# Patient Record
Sex: Male | Born: 1965 | Race: White | Hispanic: No | Marital: Single | Smoking: Never smoker
Health system: Southern US, Community
[De-identification: ages and names within clinical notes are randomized; demographics above are authoritative.]

## PROBLEM LIST (undated history)

## (undated) DIAGNOSIS — G40909 Epilepsy, unspecified, not intractable, without status epilepticus: Secondary | ICD-10-CM

## (undated) DIAGNOSIS — F4310 Post-traumatic stress disorder, unspecified: Secondary | ICD-10-CM

## (undated) DIAGNOSIS — I10 Essential (primary) hypertension: Secondary | ICD-10-CM

## (undated) DIAGNOSIS — M1990 Unspecified osteoarthritis, unspecified site: Secondary | ICD-10-CM

## (undated) DIAGNOSIS — R51 Headache: Secondary | ICD-10-CM

## (undated) DIAGNOSIS — F419 Anxiety disorder, unspecified: Secondary | ICD-10-CM

## (undated) DIAGNOSIS — I2699 Other pulmonary embolism without acute cor pulmonale: Secondary | ICD-10-CM

## (undated) HISTORY — DX: Essential (primary) hypertension: I10

## (undated) HISTORY — PX: APPENDECTOMY: SHX54

## (undated) HISTORY — PX: BACK SURGERY: SHX140

---

## 1997-11-26 HISTORY — PX: HIP SURGERY: SHX245

## 2002-08-01 ENCOUNTER — Emergency Department (HOSPITAL_COMMUNITY): Admission: EM | Admit: 2002-08-01 | Discharge: 2002-08-01 | Payer: Self-pay | Admitting: Emergency Medicine

## 2002-10-10 ENCOUNTER — Encounter: Payer: Self-pay | Admitting: Emergency Medicine

## 2002-10-10 ENCOUNTER — Observation Stay (HOSPITAL_COMMUNITY): Admission: EM | Admit: 2002-10-10 | Discharge: 2002-10-10 | Payer: Self-pay | Admitting: Emergency Medicine

## 2002-10-10 ENCOUNTER — Encounter: Payer: Self-pay | Admitting: *Deleted

## 2003-02-21 ENCOUNTER — Emergency Department (HOSPITAL_COMMUNITY): Admission: EM | Admit: 2003-02-21 | Discharge: 2003-02-21 | Payer: Self-pay | Admitting: Emergency Medicine

## 2004-04-15 ENCOUNTER — Emergency Department (HOSPITAL_COMMUNITY): Admission: EM | Admit: 2004-04-15 | Discharge: 2004-04-15 | Payer: Self-pay | Admitting: Emergency Medicine

## 2008-08-26 HISTORY — PX: DOPPLER ECHOCARDIOGRAPHY: SHX263

## 2010-11-21 ENCOUNTER — Emergency Department (HOSPITAL_COMMUNITY)
Admission: EM | Admit: 2010-11-21 | Discharge: 2010-11-21 | Payer: Self-pay | Source: Home / Self Care | Admitting: Emergency Medicine

## 2011-02-05 LAB — CBC
HCT: 45.8 % (ref 39.0–52.0)
Hemoglobin: 15.7 g/dL (ref 13.0–17.0)
MCH: 32.4 pg (ref 26.0–34.0)
MCHC: 34.3 g/dL (ref 30.0–36.0)
MCV: 94.4 fL (ref 78.0–100.0)
Platelets: 166 10*3/uL (ref 150–400)
RBC: 4.85 MIL/uL (ref 4.22–5.81)
RDW: 15.1 % (ref 11.5–15.5)
WBC: 4.8 10*3/uL (ref 4.0–10.5)

## 2011-02-05 LAB — COMPREHENSIVE METABOLIC PANEL
ALT: 40 U/L (ref 0–53)
AST: 46 U/L — ABNORMAL HIGH (ref 0–37)
Albumin: 4.2 g/dL (ref 3.5–5.2)
Alkaline Phosphatase: 71 U/L (ref 39–117)
BUN: 12 mg/dL (ref 6–23)
CO2: 27 mEq/L (ref 19–32)
Calcium: 9.7 mg/dL (ref 8.4–10.5)
Chloride: 102 mEq/L (ref 96–112)
Creatinine, Ser: 0.85 mg/dL (ref 0.4–1.5)
GFR calc Af Amer: >60 mL/min (ref >60)
GFR calc non Af Amer: >60 mL/min (ref >60)
Glucose, Bld: 92 mg/dL (ref 70–99)
Potassium: 4.4 mEq/L (ref 3.5–5.1)
Sodium: 137 mEq/L (ref 135–145)
Total Bilirubin: 0.8 mg/dL (ref 0.3–1.2)
Total Protein: 7.3 g/dL (ref 6.0–8.3)

## 2011-02-05 LAB — DIFFERENTIAL
Basophils Absolute: 0 10*3/uL (ref 0.0–0.1)
Basophils Relative: 1 % (ref 0–1)
Eosinophils Absolute: 0.1 10*3/uL (ref 0.0–0.7)
Eosinophils Relative: 1 % (ref 0–5)
Monocytes Absolute: 0.4 10*3/uL (ref 0.1–1.0)

## 2011-02-05 LAB — URINALYSIS, ROUTINE W REFLEX MICROSCOPIC
Bilirubin Urine: NEGATIVE
Hgb urine dipstick: NEGATIVE
Protein, ur: NEGATIVE mg/dL
Urobilinogen, UA: 0.2 mg/dL (ref 0.0–1.0)

## 2011-02-05 LAB — URINE CULTURE: Culture: NO GROWTH

## 2011-05-14 ENCOUNTER — Ambulatory Visit (INDEPENDENT_AMBULATORY_CARE_PROVIDER_SITE_OTHER): Payer: BC Managed Care – PPO | Admitting: Medical

## 2011-05-14 ENCOUNTER — Other Ambulatory Visit: Payer: Self-pay | Admitting: Medical

## 2011-05-14 ENCOUNTER — Encounter: Payer: Self-pay | Admitting: Medical

## 2011-05-14 VITALS — BP 106/70 | HR 84 | Temp 98.0°F | Ht 75.0 in | Wt 213.0 lb

## 2011-05-14 DIAGNOSIS — Z23 Encounter for immunization: Secondary | ICD-10-CM

## 2011-05-14 DIAGNOSIS — Z0289 Encounter for other administrative examinations: Secondary | ICD-10-CM

## 2011-05-14 DIAGNOSIS — Z021 Encounter for pre-employment examination: Secondary | ICD-10-CM

## 2011-05-14 LAB — COMPREHENSIVE METABOLIC PANEL
ALT: 23 U/L (ref 0–53)
Albumin: 4.5 g/dL (ref 3.5–5.2)
CO2: 28 mEq/L (ref 19–32)
Calcium: 9.7 mg/dL (ref 8.4–10.5)
Chloride: 98 mEq/L (ref 96–112)
Glucose, Bld: 81 mg/dL (ref 70–99)
Potassium: 4.3 mEq/L (ref 3.5–5.3)
Sodium: 137 mEq/L (ref 135–145)
Total Protein: 7.1 g/dL (ref 6.0–8.3)

## 2011-05-14 LAB — POCT URINALYSIS DIPSTICK
Blood, UA: NEGATIVE
Glucose, UA: NEGATIVE
Nitrite, UA: NEGATIVE
Urobilinogen, UA: NEGATIVE

## 2011-05-14 LAB — LIPID PANEL
LDL Cholesterol: 79 mg/dL (ref 0–99)
Triglycerides: 157 mg/dL — ABNORMAL HIGH (ref <150)

## 2011-05-14 NOTE — Progress Notes (Signed)
Subjective:   HPI  Craig Myers is a 45 y.o. male who presents for a complete exam and pre-employment screening for contract work with the [employer].  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 He is here today for screening and exam.  He has a Veterinary surgeon physical form and has forms requiring multiple screening tests.  He notes that any exam, labs, or vaccinations not available here can be completed through the [employer] evaluation or through other sources.  Drug screening will take place through the military just before deployment and can be left off today.    He notes that he is healthy, cycles 35-40 miles daily, eats healthy, and has no c/o.  He does note a history of abnormal EKG on screening test in 2009 in [place], and he went on to have subsequent ultrasound which was normal.  He had no cardiac symptoms, and he was asymptomatic at that time.  He was diagnosed with borderline HTN in the past, was on medical briefly, but after good diet and exercise control, he has been normotensive since.  Otherwise he is in his normal state of health without c/o.  Review of Systems Constitutional: denies fever, chills, sweats, unexpected weight change, anorexia, fatigue Allergy: negative; denies recent sneezing, itching, congestion Dermatology: denies changing moles, rash, lumps, new worrisome lesions ENT: no runny nose, ear pain, sore throat, hoarseness, sinus pain, teeth pain, tinnitus, hearing loss, epistaxis Cardiology: denies chest pain, palpitations, edema, orthopnea, paroxysmal nocturnal dyspnea Respiratory: denies cough, shortness of breath, dyspnea on exertion, wheezing, hemoptysis Gastroenterology: denies abdominal pain, nausea, vomiting, diarrhea, constipation, blood in stool, changes in bowel movement, dysphagia Hematology: denies bleeding or bruising problems Musculoskeletal: denies  arthralgias, myalgias, joint swelling, back pain, neck pain, cramping, gait changes Ophthalmology: denies vision changes, eye redness, itching, discharge Urology: denies dysuria, difficulty urinating, hematuria, urinary frequency, urgency, incontinence Neurology: no headache, weakness, tingling, numbness, speech abnormality, memory loss, falls, dizziness Psychology: denies depressed mood, agitation, sleep problems     Objective:   Physical Exam  General appearance: alert, no distress, WD/WN, white male, healthy appearing, lean Skin: unremarkable, no worrisome lesions; he has a dragon tattoo in the upper middle of his back. On his left arm antecubital area anteriorly he has a small panther head tattoo. On his right lateral biceps he has a washed out eagle tattoo. HEENT: normocephalic, conjunctiva/corneas normal, sclerae anicteric, PERRLA, EOMi, nares patent, no discharge or erythema, pharynx normal Oral cavity: MMM, tongue normal, teeth normal Neck: supple, no lymphadenopathy, no thyromegaly, no masses, normal ROM, no JVD or bruits Chest: non tender, normal shape and expansion Heart: RRR, normal S1, S2, no murmurs Lungs: CTA bilaterally, no wheezes, rhonchi, or rales Abdomen: +bs, soft, non tender, non distended, no masses, no hepatomegaly, no splenomegaly, no bruits Back: non tender, normal ROM, no scoliosis Musculoskeletal: upper extremities non tender, no obvious deformity, normal ROM throughout, lower extremities non tender, no obvious deformity, normal ROM throughout Extremities: no edema, no cyanosis, no clubbing Pulses: 2+ symmetric, upper and lower extremities, normal cap refill Neurological: alert, oriented x 3, CN2-12 intact, strength normal upper extremities and lower extremities, sensation normal throughout, DTRs 2+ throughout, no cerebellar signs, gait normal Psychiatric: normal affect, behavior normal, pleasant  GU: slight solid texture 1mm x 1mm lesion of right superior  testicle, but very difficulty to discern from other tissue, likely benign, otherwise testicles non tender,  no masses, no hernia, no lymphadenopathy Rectal- anus normal appearing, no lesions   Assessment :    Encounter Diagnosis  Name Primary?  . Physical exam, pre-employment Yes    Plan:    Impression: Healthy. I reviewed his EKG and cardiology notes from October 2009, and at that time his echocardiogram showed normal left ventricle systolic function, wall thickness was mildly increased, his ejection fraction was 66%. At that time he was on Zestril 10 mg daily, and no cardiac follow up was needed, and he was to followup with primary care. Currently his blood pressure is well controlled with exercise and diet. I advised he continue healthy diet, exercise, discussed preventative care, labs taken today. Hearing screen is normal. He will need to followup with his eye doctor for the complete eye exam portion of his forms. 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 He will do monthly testicular exams.  We updated his hepatitis B vaccine, TD vaccine,Varicella vaccine, and placed a PPD test. He will return in 48-72 hours for PPD reading.  Return to clinic in 48 hours.

## 2011-05-15 LAB — CBC WITH DIFFERENTIAL/PLATELET
Eosinophils Absolute: 0.1 10*3/uL (ref 0.0–0.7)
Hemoglobin: 16.9 g/dL (ref 13.0–17.0)
Lymphocytes Relative: 31 % (ref 12–46)
Lymphs Abs: 2.2 10*3/uL (ref 0.7–4.0)
MCH: 31.9 pg (ref 26.0–34.0)
Monocytes Relative: 9 % (ref 3–12)
Neutro Abs: 4.1 10*3/uL (ref 1.7–7.7)
Neutrophils Relative %: 58 % (ref 43–77)
RBC: 5.29 MIL/uL (ref 4.22–5.81)
WBC: 7 10*3/uL (ref 4.0–10.5)

## 2011-05-16 ENCOUNTER — Ambulatory Visit (INDEPENDENT_AMBULATORY_CARE_PROVIDER_SITE_OTHER): Payer: BC Managed Care – PPO | Admitting: Medical

## 2011-05-16 ENCOUNTER — Encounter: Payer: Self-pay | Admitting: Medical

## 2011-05-16 VITALS — BP 120/72 | HR 75 | Temp 98.1°F | Ht 75.0 in | Wt 221.0 lb

## 2011-05-16 DIAGNOSIS — I517 Cardiomegaly: Secondary | ICD-10-CM

## 2011-05-16 DIAGNOSIS — Z111 Encounter for screening for respiratory tuberculosis: Secondary | ICD-10-CM

## 2011-05-16 LAB — TB SKIN TEST: TB Skin Test: NEGATIVE mm

## 2011-05-16 LAB — GLUCOSE 6 PHOSPHATE DEHYDROGENASE: G-6-PD, Quant: 11.6 U/GM HGB (ref 7.0–20.5)

## 2011-05-16 NOTE — Progress Notes (Signed)
Subjective:   HPI  Craig Myers is a 45 y.o. male who presents for f/u from physical 2 days ago.  He is here for PPD reading and to discuss forms and labs.  No other new c/o.    The following portions of the patient's history were reviewed and updated as appropriate: allergies, current medications, past family history, past medical history, past social history, past surgical history and problem list.  Past Medical History  Diagnosis Date  . Hypertension     hx/o borderline, controlled currently 6/12    Review of Systems Complete review of systems is negative.    Objective:   Physical Exam  General appearance: alert, no distress, WD/WN  Skin: no forearm erythema or induration.  PPD negative.   Assessment :    Encounter Diagnoses  Name Primary?  . Left ventricular hypertrophy Yes  . Screening for tuberculosis      Plan:    Reviewed his recent labs which were all normal, documented negative PPD, discussed the cardiology notes that had come in regarding prior echocardiogram in 2009. We went over his forms. Answered his questions. The G6PD test is still pending, otherwise he needs to follow up with his eye doctor to complete the eye exam portion of the forearms.  Advised he f/u in year regarding blood pressure check, yearly physical, and recheck on minor testicular lesion.

## 2011-05-16 NOTE — Patient Instructions (Signed)
Preventative Care for Adults, Male       REGULAR HEALTH EXAMS:  A routine yearly physical is a good way to check in with your primary care provider about your health and preventive screening. It is also an opportunity to share updates about your health and any concerns you have, and receive a thorough all-over exam.   Most health insurance companies pay for at least some preventative services.  Check with your health plan for specific coverages.  WHAT PREVENTATIVE SERVICES DO MEN NEED?  Adult men should have their weight and blood pressure checked regularly.   Men age 35 and older should have their cholesterol levels checked regularly.  Beginning at age 50 and continuing to age 75, men should be screened for colorectal cancer.  Certain people should may need continued testing until age 85.  Other cancer screening may include exams for testicular and prostate cancer.  Updating vaccinations is part of preventative care.  Vaccinations help protect against diseases such as the flu.  Lab tests are generally done as part of preventative care to screen for anemia and blood disorders, to screen for problems with the kidneys and liver, to screen for bladder problems, to check blood sugar, and to check your cholesterol level.  Preventative services generally include counseling about diet, exercise, avoiding tobacco, drugs, excessive alcohol consumption, and sexually transmitted infections.    GENERAL RECOMMENDATIONS FOR GOOD HEALTH:  Healthy diet:  Eat a variety of foods, including fruit, vegetables, animal or vegetable protein, such as meat, fish, chicken, and eggs, or beans, lentils, tofu, and grains, such as rice.  Drink plenty of water daily.  Decrease saturated fat in the diet, avoid lots of red meat, processed foods, sweets, fast foods, and fried foods.  Exercise:  Aerobic exercise helps maintain good heart health. At least 30-40 minutes of moderate-intensity exercise is recommended.  For example, a brisk walk that increases your heart rate and breathing. This should be done on most days of the week.   Find a type of exercise or a variety of exercises that you enjoy so that it becomes a part of your daily life.  Examples are running, walking, swimming, water aerobics, and biking.  For motivation and support, explore group exercise such as aerobic class, spin class, Zumba, Yoga,or  martial arts, etc.    Set exercise goals for yourself, such as a certain weight goal, walk or run in a race such as a 5k walk/run.  Speak to your primary care provider about exercise goals.  Disease prevention:  If you smoke or chew tobacco, find out from your caregiver how to quit. It can literally save your life, no matter how long you have been a tobacco user. If you do not use tobacco, never begin.   Maintain a healthy diet and normal weight. Increased weight leads to problems with blood pressure and diabetes.   The Body Mass Index or BMI is a way of measuring how much of your body is fat. Having a BMI above 27 increases the risk of heart disease, diabetes, hypertension, stroke and other problems related to obesity. Your caregiver can help determine your BMI and based on it develop an exercise and dietary program to help you achieve or maintain this important measurement at a healthful level.  High blood pressure causes heart and blood vessel problems.  Persistent high blood pressure should be treated with medicine if weight loss and exercise do not work.   Fat and cholesterol leaves deposits in your arteries   that can block them. This causes heart disease and vessel disease elsewhere in your body.  If your cholesterol is found to be high, or if you have heart disease or certain other medical conditions, then you may need to have your cholesterol monitored frequently and be treated with medication.   Ask if you should have a stress test if your history suggests this. A stress test is a test done on  a treadmill that looks for heart disease. This test can find disease prior to there being a problem.  Avoid drinking alcohol in excess (more than two drinks per day).  Avoid use of street drugs. Do not share needles with anyone. Ask for professional help if you need assistance or instructions on stopping the use of alcohol, cigarettes, and/or drugs.  Brush your teeth twice a day with fluoride toothpaste, and floss once a day. Good oral hygiene prevents tooth decay and gum disease. The problems can be painful, unattractive, and can cause other health problems. Visit your dentist for a routine oral and dental check up and preventive care every 6-12 months.   Look at your skin regularly.  Use a mirror to look at your back. Notify your caregivers of changes in moles, especially if there are changes in shapes, colors, a size larger than a pencil eraser, an irregular border, or development of new moles.  Safety:  Use seatbelts 100% of the time, whether driving or as a passenger.  Use safety devices such as hearing protection if you work in environments with loud noise or significant background noise.  Use safety glasses when doing any work that could send debris in to the eyes.  Use a helmet if you ride a bike or motorcycle.  Use appropriate safety gear for contact sports.  Talk to your caregiver about gun safety.  Use sunscreen with a SPF (or skin protection factor) of 15 or greater.  Lighter skinned people are at a greater risk of skin cancer. Don't forget to also wear sunglasses in order to protect your eyes from too much damaging sunlight. Damaging sunlight can accelerate cataract formation.   Practice safe sex. Use condoms. Condoms are used for birth control and to help reduce the spread of sexually transmitted infections (or STIs).  Some of the STIs are gonorrhea (the clap), chlamydia, syphilis, trichomonas, herpes, HPV (human papilloma virus) and HIV (human immunodeficiency virus) which causes AIDS.  The herpes, HIV and HPV are viral illnesses that have no cure. These can result in disability, cancer and death.   Keep carbon monoxide and smoke detectors in your home functioning at all times. Change the batteries every 6 months or use a model that plugs into the wall.   Vaccinations:  Stay up to date with your tetanus shots and other required immunizations. You should have a booster for tetanus every 10 years. Be sure to get your flu shot every year, since 5%-20% of the [place] population comes down with the flu. The flu vaccine changes each year, so being vaccinated once is not enough. Get your shot in the fall, before the flu season peaks.   Other vaccines to consider:  Pneumococcal vaccine to protect against certain types of pneumonia.  This is normally recommended for adults age 65 or older.  However, adults younger than 43 years old with certain underlying conditions such as diabetes, heart or lung disease should also receive the vaccine.  Shingles vaccine to protect against Varicella Zoster if you are older than age 60, or younger   than 43 years old with certain underlying illness.  Hepatitis A vaccine to protect against a form of infection of the liver by a virus acquired from food.  Hepatitis B vaccine to protect against a form of infection of the liver by a virus acquired from blood or body fluids, particularly if you work in health care.  If you plan to travel internationally, check with your local health department for specific vaccination recommendations.  Cancer Screening:  Most routine colon cancer screening begins at the age of 50. On a yearly basis, doctors may provide special easy to use take-home tests to check for hidden blood in the stool. Sigmoidoscopy or colonoscopy can detect the earliest forms of colon cancer and is life saving. These tests use a small camera at the end of a tube to directly examine the colon. Speak to your caregiver about this at age 50, when routine  screening begins (and is repeated every 5 years unless early forms of pre-cancerous polyps or small growths are found).   At the age of 50 men usually start screening for prostate cancer every year. Screening may begin at a younger age for those with higher risk. Those at higher risk include African-Americans or having a family history of prostate cancer. There are two types of tests for prostate cancer:   Prostate-specific antigen (PSA) testing. Recent studies raise questions about prostate cancer using PSA and you should discuss this with your caregiver.   Digital rectal exam (in which your doctor's lubricated and gloved finger feels for enlargement of the prostate through the anus).   Screening for testicular cancer.  Do a monthly exam of your testicles. Gently roll each testicle between your thumb and fingers, feeling for any abnormal lumps. The best time to do this is after a hot shower or bath when the tissues are looser. Notify your caregivers of any lumps, tenderness or changes in size or shape immediately.     

## 2011-05-18 ENCOUNTER — Telehealth: Payer: Self-pay | Admitting: *Deleted

## 2011-05-18 NOTE — Telephone Encounter (Addendum)
Message copied by Dorthula Perfect on Fri May 18, 2011  1:47 PM ------      Message from: Aleen Campi, Kermit Balo      Created: Fri May 18, 2011  1:23 PM       G6PD test is normal.  Pls get him copy of that lab.     Pt notified of lab result.  Copy at front desk for pt to pick up.  CM, LPN

## 2011-05-28 ENCOUNTER — Other Ambulatory Visit: Payer: Self-pay | Admitting: Physical Medicine and Rehabilitation

## 2011-05-28 DIAGNOSIS — M25552 Pain in left hip: Secondary | ICD-10-CM

## 2011-05-29 ENCOUNTER — Other Ambulatory Visit: Payer: BC Managed Care – PPO

## 2011-05-29 ENCOUNTER — Ambulatory Visit
Admission: RE | Admit: 2011-05-29 | Discharge: 2011-05-29 | Disposition: A | Discharge status: Home / Self Care | Payer: BC Managed Care – PPO | Source: Ambulatory Visit | Attending: Physical Medicine and Rehabilitation | Admitting: Physical Medicine and Rehabilitation

## 2011-05-29 DIAGNOSIS — M25552 Pain in left hip: Secondary | ICD-10-CM

## 2011-11-23 ENCOUNTER — Other Ambulatory Visit: Payer: Self-pay | Admitting: Orthopedic Surgery

## 2011-11-23 ENCOUNTER — Ambulatory Visit
Admission: RE | Admit: 2011-11-23 | Discharge: 2011-11-23 | Disposition: A | Discharge status: Home / Self Care | Payer: BC Managed Care – PPO | Source: Ambulatory Visit | Attending: Orthopedic Surgery | Admitting: Orthopedic Surgery

## 2011-11-23 DIAGNOSIS — M545 Low back pain: Secondary | ICD-10-CM

## 2011-11-25 ENCOUNTER — Emergency Department (HOSPITAL_COMMUNITY): Payer: BC Managed Care – PPO

## 2011-11-25 ENCOUNTER — Encounter (HOSPITAL_COMMUNITY): Payer: Self-pay | Admitting: *Deleted

## 2011-11-25 ENCOUNTER — Emergency Department (HOSPITAL_COMMUNITY)
Admission: EM | Admit: 2011-11-25 | Discharge: 2011-11-25 | Disposition: A | Discharge status: Home / Self Care | Payer: BC Managed Care – PPO | Attending: Emergency Medicine | Admitting: Emergency Medicine

## 2011-11-25 DIAGNOSIS — M62838 Other muscle spasm: Secondary | ICD-10-CM

## 2011-11-25 DIAGNOSIS — Z86711 Personal history of pulmonary embolism: Secondary | ICD-10-CM | POA: Insufficient documentation

## 2011-11-25 DIAGNOSIS — M79609 Pain in unspecified limb: Secondary | ICD-10-CM | POA: Insufficient documentation

## 2011-11-25 DIAGNOSIS — S0990XA Unspecified injury of head, initial encounter: Secondary | ICD-10-CM | POA: Insufficient documentation

## 2011-11-25 DIAGNOSIS — Z79899 Other long term (current) drug therapy: Secondary | ICD-10-CM | POA: Insufficient documentation

## 2011-11-25 DIAGNOSIS — S43016A Anterior dislocation of unspecified humerus, initial encounter: Secondary | ICD-10-CM | POA: Insufficient documentation

## 2011-11-25 DIAGNOSIS — K146 Glossodynia: Secondary | ICD-10-CM | POA: Insufficient documentation

## 2011-11-25 DIAGNOSIS — M25519 Pain in unspecified shoulder: Secondary | ICD-10-CM | POA: Insufficient documentation

## 2011-11-25 DIAGNOSIS — S0003XA Contusion of scalp, initial encounter: Secondary | ICD-10-CM | POA: Insufficient documentation

## 2011-11-25 DIAGNOSIS — S0510XA Contusion of eyeball and orbital tissues, unspecified eye, initial encounter: Secondary | ICD-10-CM | POA: Insufficient documentation

## 2011-11-25 DIAGNOSIS — T50905A Adverse effect of unspecified drugs, medicaments and biological substances, initial encounter: Secondary | ICD-10-CM

## 2011-11-25 DIAGNOSIS — R569 Unspecified convulsions: Secondary | ICD-10-CM

## 2011-11-25 DIAGNOSIS — S0181XA Laceration without foreign body of other part of head, initial encounter: Secondary | ICD-10-CM

## 2011-11-25 DIAGNOSIS — G40909 Epilepsy, unspecified, not intractable, without status epilepticus: Secondary | ICD-10-CM | POA: Insufficient documentation

## 2011-11-25 DIAGNOSIS — X58XXXA Exposure to other specified factors, initial encounter: Secondary | ICD-10-CM | POA: Insufficient documentation

## 2011-11-25 DIAGNOSIS — S0180XA Unspecified open wound of other part of head, initial encounter: Secondary | ICD-10-CM | POA: Insufficient documentation

## 2011-11-25 DIAGNOSIS — S43005A Unspecified dislocation of left shoulder joint, initial encounter: Secondary | ICD-10-CM

## 2011-11-25 HISTORY — DX: Other pulmonary embolism without acute cor pulmonale: I26.99

## 2011-11-25 HISTORY — DX: Epilepsy, unspecified, not intractable, without status epilepticus: G40.909

## 2011-11-25 HISTORY — DX: Post-traumatic stress disorder, unspecified: F43.10

## 2011-11-25 HISTORY — DX: Anxiety disorder, unspecified: F41.9

## 2011-11-25 LAB — CBC
HCT: 49 % (ref 39.0–52.0)
MCH: 34.1 pg — ABNORMAL HIGH (ref 26.0–34.0)
MCV: 91.2 fL (ref 78.0–100.0)
Platelets: 149 10*3/uL — ABNORMAL LOW (ref 150–400)
RBC: 5.37 MIL/uL (ref 4.22–5.81)
RDW: 13.1 % (ref 11.5–15.5)
WBC: 16.7 10*3/uL — ABNORMAL HIGH (ref 4.0–10.5)

## 2011-11-25 LAB — BASIC METABOLIC PANEL
CO2: 23 mEq/L (ref 19–32)
Calcium: 9.4 mg/dL (ref 8.4–10.5)
Chloride: 95 mEq/L — ABNORMAL LOW (ref 96–112)
Creatinine, Ser: 1.03 mg/dL (ref 0.50–1.35)
Glucose, Bld: 122 mg/dL — ABNORMAL HIGH (ref 70–99)

## 2011-11-25 MED ORDER — POTASSIUM CHLORIDE CRYS ER 20 MEQ PO TBCR
40.0000 meq | EXTENDED_RELEASE_TABLET | Freq: Once | ORAL | Status: AC
  Administered 2011-11-25: 40 meq via ORAL
  Filled 2011-11-25: qty 2
  Filled 2011-11-25: qty 1

## 2011-11-25 MED ORDER — ETOMIDATE 2 MG/ML IV SOLN
20.0000 mg | Freq: Once | INTRAVENOUS | Status: AC
  Administered 2011-11-25: 20 mg via INTRAVENOUS
  Filled 2011-11-25: qty 20

## 2011-11-25 MED ORDER — HYDROMORPHONE HCL PF 1 MG/ML IJ SOLN
1.0000 mg | Freq: Once | INTRAMUSCULAR | Status: AC
  Administered 2011-11-25: 1 mg via INTRAVENOUS
  Filled 2011-11-25: qty 1

## 2011-11-25 MED ORDER — TETANUS-DIPHTH-ACELL PERTUSSIS 5-2.5-18.5 LF-MCG/0.5 IM SUSP: 0.5000 mL | Freq: Once | INTRAMUSCULAR | Status: DC

## 2011-11-25 MED ORDER — LORAZEPAM 2 MG/ML IJ SOLN
2.0000 mg | Freq: Once | INTRAMUSCULAR | Status: AC
  Administered 2011-11-25: 2 mg via INTRAVENOUS
  Filled 2011-11-25: qty 1

## 2011-11-25 MED ORDER — METHOCARBAMOL 500 MG PO TABS: 500.0000 mg | ORAL_TABLET | Freq: Two times a day (BID) | ORAL | Status: AC | PRN

## 2011-11-25 MED ORDER — PROPOFOL 10 MG/ML IV EMUL
INTRAVENOUS | Status: AC
  Filled 2011-11-25: qty 20

## 2011-11-25 MED ORDER — LORAZEPAM 2 MG/ML IJ SOLN
INTRAMUSCULAR | Status: AC
  Filled 2011-11-25: qty 1

## 2011-11-25 MED ORDER — OXYCODONE-ACETAMINOPHEN 5-325 MG PO TABS: 1.0000 | ORAL_TABLET | ORAL | Status: AC | PRN

## 2011-11-25 MED ORDER — NAPROXEN 500 MG PO TABS: 500.0000 mg | ORAL_TABLET | Freq: Two times a day (BID) | ORAL | Status: AC
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
## 2011-11-25 NOTE — ED Notes (Signed)
[facility] present to transport pt to residence.

## 2011-11-25 NOTE — ED Provider Notes (Addendum)
History     CSN: [id number]  Arrival date & time      First MD Initiated Contact with Patient 11/25/11 0441      Chief Complaint  Patient presents with  . Seizures    Head injury, leg pain    (Consider location/radiation/quality/duration/timing/severity/associated sxs/prior treatment) HPI Comments: Patient is a 45 year old male who presents with recurrent seizures. He states that he had seizures when he was a teenager but it has been many many years since that time. At some time this evening he had a seizure of which she does not remember, had head injury, bit his tongue and was found on the couch by his wife when she arrived home. He was unable to answer what happened to him but she did witness a second seizure which occurred a short time ago. This was tonic-clonic in nature, generalized body shaking, unresponsive, grunting, eyes rolling back. Again associated with tongue biting. This lasted approximately 5 minutes, resolve spontaneously and paramedics were called for transport. Again patient does remember feeling bad before the seizure. In the last several days the patient started treatment for right leg spasm with Flexeril and Ultram. He has been taking Ultram multiple times a day as prescribed and has had several doses of Flexeril over the recommended amount. He did not having dry mouth prior to seizures today. At this time he complains of right posterior thigh pain, left shoulder pain and tongue pain after the fall. Symptoms are severe, recurrent, not associated with alcohol use as the patient drinks only rarely. Of note he does use Xanax for anxiety and did miss his nighttime dose tonight.  The history is provided by the patient, the spouse and the EMS personnel.    Past Medical History  Diagnosis Date  . Hypertension     hx/o borderline, controlled currently 6/12  . Epileptic   . PTSD (post-traumatic stress disorder)   . Anxiety   . Pulmonary embolism     Past Surgical History    Procedure Date  . Appendectomy   . Hip surgery 2000  . Doppler echocardiography 08/2008    mild LV thickening, EF 66%    History reviewed. No pertinent family history.  History  Substance Use Topics  . Smoking status: Never Smoker   . Smokeless tobacco: Never Used  . Alcohol Use: 1.0 oz/week    2 drink(s) per week      Review of Systems  All other systems reviewed and are negative.    Allergies  Codeine  Home Medications   Current Outpatient Rx  Name Route Sig Dispense Refill  . PRESCRIPTION MEDICATION  HCTZ unknown dosage call [facility]       There were no vitals taken for this visit.  Physical Exam  Nursing note and vitals reviewed. Constitutional: He appears well-developed and well-nourished.       Uncomfortable appearing  HENT:  Head: Normocephalic.  Mouth/Throat: Oropharynx is clear and moist. No oropharyngeal exudate.       Multiple small bite marks to the edges of the anterior tongue. No repairable lacerations, teeth intact.   small lacerations above the right eyebrow, contusion with periorbital ecchymosis of the right. No malocclusion  Eyes: Conjunctivae and EOM are normal. Pupils are equal, round, and reactive to light. Right eye exhibits no discharge. Left eye exhibits no discharge. No scleral icterus.  Neck: Normal range of motion. Neck supple. No JVD present. No thyromegaly present.  Cardiovascular: Normal rate, regular rhythm, normal heart sounds and intact distal  pulses.  Exam reveals no gallop and no friction rub.   No murmur heard. Pulmonary/Chest: Effort normal and breath sounds normal. No respiratory distress. He has no wheezes. He has no rales.  Abdominal: Soft. Bowel sounds are normal. He exhibits no distension and no mass. There is no tenderness.  Musculoskeletal: Normal range of motion. He exhibits tenderness ( Tenderness in the left shoulder with range of motion and palpation, no deformity seen. Pain to the right posterior thigh in the  hamstring area.). He exhibits no edema.       Tenderness over the left shoulder with inability to AB duct the shoulder.  Normal pulses distal to the shoulder  Lymphadenopathy:    He has no cervical adenopathy.  Neurological: He is alert. Coordination normal.       Patient history questions appropriately, has some memory lapse through the day but does recall taking the medications. He denies overt overdose though does admit to taking an extra dose of Flexeril. Following commands appropriately, moving all extremities x4, sensation intact, pupillary exam normal and there is no anisocoria  Skin: Skin is warm and dry. No rash noted. No erythema.  Psychiatric: He has a normal mood and affect. His behavior is normal.    ED Course  Procedural sedation Date/Time: 11/25/2011 5:50 AM Performed by: Eber Hong D Authorized by: Eber Hong D Consent: Verbal consent obtained. Written consent obtained. Risks and benefits: risks, benefits and alternatives were discussed Consent given by: patient Patient understanding: patient states understanding of the procedure being performed Patient consent: the patient's understanding of the procedure matches consent given Procedure consent: procedure consent matches procedure scheduled Relevant documents: relevant documents present and verified Test results: test results available and properly labeled Site marked: the operative site was marked Imaging studies: imaging studies available Required items: required blood products, implants, devices, and special equipment available Patient identity confirmed: verbally with patient and arm band Time out: Immediately prior to procedure a "time out" was called to verify the correct patient, procedure, equipment, support staff and site/side marked as required. Preparation: Patient was prepped and draped in the usual sterile fashion. Local anesthesia used: no Patient sedated: yes Sedatives: etomidate Analgesia:  hydromorphone Sedation start date/time: 11/25/2011 5:50 AM Sedation end date/time: 11/25/2011 6:15 AM Vitals: Vital signs were monitored during sedation. Patient tolerance: Patient tolerated the procedure well with no immediate complications. Comments: Patient had temporary and brief myoclonic jerking shortly after etomidate given, resolve spontaneously, no drop in oxygenation, no tachycardia and no hyper or hypotension.  Reduction of dislocation Date/Time: 11/25/2011 5:50 AM Performed by: Eber Hong D Authorized by: Eber Hong D Consent: Verbal consent obtained. Written consent obtained. Risks and benefits: risks, benefits and alternatives were discussed Consent given by: patient Patient understanding: patient states understanding of the procedure being performed Patient consent: the patient's understanding of the procedure matches consent given Procedure consent: procedure consent matches procedure scheduled Relevant documents: relevant documents present and verified Test results: test results available and properly labeled Site marked: the operative site was marked Imaging studies: imaging studies available Required items: required blood products, implants, devices, and special equipment available Patient identity confirmed: verbally with patient and arm band Time out: Immediately prior to procedure a "time out" was called to verify the correct patient, procedure, equipment, support staff and site/side marked as required. Preparation: Patient was prepped and draped in the usual sterile fashion. Patient sedated: yes Sedatives: etomidate Analgesia: hydromorphone Vitals: Vital signs were monitored during sedation. Patient tolerance: Patient tolerated the procedure  well with no immediate complications. Comments: Left shoulder anterior dislocation reduced with traction countertraction and external rotation shoulder manipulation. Post procedure patient was essentially pain-free. Post  reduction films ordered  LACERATION REPAIR Date/Time: 11/25/2011 6:00 AM Performed by: Eber Hong D Authorized by: Eber Hong D Consent: Verbal consent obtained. Risks and benefits: risks, benefits and alternatives were discussed Consent given by: patient Patient understanding: patient states understanding of the procedure being performed Patient identity confirmed: verbally with patient and arm band Time out: Immediately prior to procedure a "time out" was called to verify the correct patient, procedure, equipment, support staff and site/side marked as required. Body area: head/neck Location details: forehead Laceration length: 1 cm Foreign bodies: no foreign bodies Tendon involvement: none Nerve involvement: none Vascular damage: no Anesthesia method: None. Patient sedated: yes (C. other procedure note) Preparation: Patient was prepped and draped in the usual sterile fashion. Irrigation solution: saline Amount of cleaning: standard Debridement: none Degree of undermining: none Skin closure: glue Number of sutures: Dermabond. Suture technique: dermabond. Approximation: close Approximation difficulty: simple Dressing: 4x4 sterile gauze Patient tolerance: Patient tolerated the procedure well with no immediate complications. Comments: Laceration repaired with Dermabond adhesive while patient sedated for shoulder reduction.   (including critical care time)  Labs Reviewed  BASIC METABOLIC PANEL - Abnormal; Notable for the following:    Sodium 134 (*)    Potassium 2.8 (*)    Chloride 95 (*)    Glucose, Bld 122 (*)    GFR calc non Af Amer 87 (*)    All other components within normal limits  CBC - Abnormal; Notable for the following:    WBC 16.7 (*)    Hemoglobin 18.3 (*)    MCH 34.1 (*)    Platelets 149 (*)    All other components within normal limits     1. Seizure   2. Medication reaction   3. Dislocation of left shoulder joint   4. Laceration of forehead    5. Muscle spasm       MDM  Concern for reaction to medications likely tramadol, possible head injury causing recurrent seizure. Will need CT scan to rule out intracranial mass or injury, lab work, laceration repair the forehead. Ativan given on arrival, hydromorphone intravenous given on arrival  Imaging support his left anterior shoulder dislocation, no injury to the right hip or to the intracranial area.  Imaging post reduction shows good reduction of the shoulder with positive Hill-Sachs deformity, knee x-ray shows possible avulsion fracture. Patient has good range of motion of the knee with minimal pain. Knee immobilizer, shoulder immobilizer and orthopedic followup - discussed all imaging results with patient and family member. Patient is alert oriented and has not had any further seizure since arrival. I have advised him to stop the tramadol immediately, I have replaced his pain medications and muscle relaxers with other alternatives.  In addition hypokalemia was treated with oral potassium supplementation.  Discharge prescriptions  #1 Robaxin #2 Percocet #3 Naprosyn      Vida Roller, MD 11/25/11 1610  Vida Roller, MD 11/25/11 [phone]

## 2011-11-25 NOTE — ED Notes (Signed)
With MD at bedside and having explained the procedure including risks and benefits, the pt consents to having etomadate induced anesthesia for relocation of his left shoulder.  Etomadate was pushed @ 0550, the left shoulder was successfully relocated @ 0555 and pt awoke easily and without dropping his O2 saturations.

## 2011-11-25 NOTE — ED Notes (Signed)
Pt reports to have had 2 seizures tonight.  Pt presents with a hematoma above his right eye, pain left shoulder, his right leg from his femur to his ankle, and left shin numbness.  Neurologic function intact.

## 2011-11-25 NOTE — ED Notes (Signed)
[facility] called for transport back home, will monitor, pt was DC by Tamera Punt, RN.

## 2011-11-25 NOTE — ED Notes (Signed)
WUJ:[id number]<JY> Expected date:11/25/11<BR> Expected time: 4:29 AM<BR> Means of arrival:Ambulance<BR> Comments:<BR> EMS - seizure/fall/head injury

## 2012-01-02 ENCOUNTER — Ambulatory Visit: Payer: Self-pay | Admitting: Family Medicine

## 2013-02-27 ENCOUNTER — Encounter (HOSPITAL_COMMUNITY): Payer: Self-pay | Admitting: *Deleted

## 2013-02-27 ENCOUNTER — Emergency Department (HOSPITAL_COMMUNITY): Payer: Managed Care, Other (non HMO) | Admitting: Anesthesiology

## 2013-02-27 ENCOUNTER — Encounter (HOSPITAL_COMMUNITY)
Admission: EM | Disposition: A | Discharge status: Home / Self Care | Payer: Self-pay | Source: Home / Self Care | Attending: Emergency Medicine

## 2013-02-27 ENCOUNTER — Emergency Department (HOSPITAL_COMMUNITY): Payer: Managed Care, Other (non HMO)

## 2013-02-27 ENCOUNTER — Emergency Department (HOSPITAL_COMMUNITY)
Admission: EM | Admit: 2013-02-27 | Discharge: 2013-02-27 | Disposition: A | Discharge status: Home / Self Care | Payer: Managed Care, Other (non HMO) | Attending: Emergency Medicine | Admitting: Emergency Medicine

## 2013-02-27 ENCOUNTER — Encounter (HOSPITAL_COMMUNITY): Payer: Self-pay | Admitting: Anesthesiology

## 2013-02-27 DIAGNOSIS — G40909 Epilepsy, unspecified, not intractable, without status epilepticus: Secondary | ICD-10-CM

## 2013-02-27 DIAGNOSIS — F4310 Post-traumatic stress disorder, unspecified: Secondary | ICD-10-CM | POA: Insufficient documentation

## 2013-02-27 DIAGNOSIS — W07XXXA Fall from chair, initial encounter: Secondary | ICD-10-CM | POA: Insufficient documentation

## 2013-02-27 DIAGNOSIS — Y9229 Other specified public building as the place of occurrence of the external cause: Secondary | ICD-10-CM | POA: Insufficient documentation

## 2013-02-27 DIAGNOSIS — S43016A Anterior dislocation of unspecified humerus, initial encounter: Secondary | ICD-10-CM | POA: Insufficient documentation

## 2013-02-27 DIAGNOSIS — S43005A Unspecified dislocation of left shoulder joint, initial encounter: Secondary | ICD-10-CM

## 2013-02-27 DIAGNOSIS — I10 Essential (primary) hypertension: Secondary | ICD-10-CM | POA: Insufficient documentation

## 2013-02-27 HISTORY — PX: SHOULDER CLOSED REDUCTION: SHX1051

## 2013-02-27 SURGERY — CLOSED REDUCTION, SHOULDER
Anesthesia: General | Site: Shoulder | Laterality: Left | Wound class: Clean

## 2013-02-27 MED ORDER — HYDROMORPHONE HCL PF 1 MG/ML IJ SOLN: 0.2500 mg | INTRAMUSCULAR | Status: DC | PRN

## 2013-02-27 MED ORDER — LORAZEPAM 2 MG/ML IJ SOLN
1.0000 mg | Freq: Once | INTRAMUSCULAR | Status: AC
  Administered 2013-02-27: 1 mg via INTRAVENOUS
  Filled 2013-02-27: qty 1

## 2013-02-27 MED ORDER — HYDROCODONE-ACETAMINOPHEN 5-325 MG PO TABS: 1.0000 | ORAL_TABLET | Freq: Four times a day (QID) | ORAL | Status: DC | PRN

## 2013-02-27 MED ORDER — FENTANYL CITRATE 0.05 MG/ML IJ SOLN
50.0000 ug | Freq: Once | INTRAMUSCULAR | Status: AC
  Administered 2013-02-27: 50 ug via INTRAVENOUS
  Filled 2013-02-27: qty 2

## 2013-02-27 MED ORDER — FENTANYL CITRATE 0.05 MG/ML IJ SOLN
INTRAMUSCULAR | Status: DC | PRN
  Administered 2013-02-27 (×3): 50 ug via INTRAVENOUS

## 2013-02-27 MED ORDER — MIDAZOLAM HCL 5 MG/5ML IJ SOLN
INTRAMUSCULAR | Status: DC | PRN
  Administered 2013-02-27: 2 mg via INTRAVENOUS

## 2013-02-27 MED ORDER — LACTATED RINGERS IV SOLN
INTRAVENOUS | Status: DC
  Administered 2013-02-27: 16:00:00 via INTRAVENOUS

## 2013-02-27 MED ORDER — SUCCINYLCHOLINE CHLORIDE 20 MG/ML IJ SOLN
INTRAMUSCULAR | Status: DC | PRN
  Administered 2013-02-27: 140 mg via INTRAVENOUS

## 2013-02-27 MED ORDER — LIDOCAINE HCL (CARDIAC) 20 MG/ML IV SOLN
INTRAVENOUS | Status: DC | PRN
  Administered 2013-02-27: 100 mg via INTRAVENOUS

## 2013-02-27 MED ORDER — PROPOFOL 10 MG/ML IV BOLUS: 0.5000 mg/kg | Freq: Once | INTRAVENOUS | Status: DC

## 2013-02-27 MED ORDER — PROPOFOL 10 MG/ML IV BOLUS
INTRAVENOUS | Status: AC | PRN
  Administered 2013-02-27: 50 mg via INTRAVENOUS

## 2013-02-27 MED ORDER — ONDANSETRON HCL 4 MG/2ML IJ SOLN
4.0000 mg | Freq: Once | INTRAMUSCULAR | Status: AC
  Administered 2013-02-27: 4 mg via INTRAVENOUS
  Filled 2013-02-27: qty 2

## 2013-02-27 MED ORDER — HYDROMORPHONE HCL PF 1 MG/ML IJ SOLN
1.0000 mg | Freq: Once | INTRAMUSCULAR | Status: DC
  Filled 2013-02-27: qty 1

## 2013-02-27 MED ORDER — FENTANYL CITRATE 0.05 MG/ML IJ SOLN
INTRAMUSCULAR | Status: AC | PRN
  Administered 2013-02-27: 150 ug via INTRAVENOUS

## 2013-02-27 MED ORDER — LACTATED RINGERS IV SOLN
INTRAVENOUS | Status: DC | PRN
  Administered 2013-02-27: 16:00:00 via INTRAVENOUS

## 2013-02-27 MED ORDER — PROPOFOL 10 MG/ML IV BOLUS
INTRAVENOUS | Status: AC
  Filled 2013-02-27: qty 20

## 2013-02-27 MED ORDER — ALPRAZOLAM 2 MG PO TABS: ORAL_TABLET | ORAL | Status: DC

## 2013-02-27 MED ORDER — ONDANSETRON HCL 4 MG/2ML IJ SOLN: 4.0000 mg | Freq: Once | INTRAMUSCULAR | Status: DC | PRN

## 2013-02-27 MED ORDER — ACETAMINOPHEN 10 MG/ML IV SOLN: 1000.0000 mg | Freq: Once | INTRAVENOUS | Status: DC | PRN

## 2013-02-27 MED ORDER — FENTANYL CITRATE 0.05 MG/ML IJ SOLN
50.0000 ug | Freq: Once | INTRAMUSCULAR | Status: AC
  Administered 2013-02-27: 13:00:00 via INTRAVENOUS

## 2013-02-27 MED ORDER — PROPOFOL 10 MG/ML IV BOLUS
INTRAVENOUS | Status: DC | PRN
  Administered 2013-02-27: 40 mg via INTRAVENOUS
  Administered 2013-02-27: 200 mg via INTRAVENOUS

## 2013-02-27 MED ORDER — FENTANYL CITRATE 0.05 MG/ML IJ SOLN
INTRAMUSCULAR | Status: AC
  Filled 2013-02-27: qty 4

## 2013-02-27 SURGICAL SUPPLY — 47 items
BIT DRILL 7/64X5 DISP (BIT) IMPLANT
BLADE AVERAGE 25X9 (BLADE) IMPLANT
CLEANER TIP ELECTROSURG 2X2 (MISCELLANEOUS) IMPLANT
CLOTH BEACON ORANGE TIMEOUT ST (SAFETY) IMPLANT
COVER SURGICAL LIGHT HANDLE (MISCELLANEOUS) IMPLANT
DECANTER SPIKE VIAL GLASS SM (MISCELLANEOUS) IMPLANT
DRAPE INCISE IOBAN 66X45 STRL (DRAPES) IMPLANT
DRAPE ORTHO SPLIT 77X108 STRL (DRAPES)
DRAPE STERI 35X30 U-POUCH (DRAPES) IMPLANT
DRAPE SURG ORHT 6 SPLT 77X108 (DRAPES) IMPLANT
DRAPE U-SHAPE 47X51 STRL (DRAPES) IMPLANT
DRSG EMULSION OIL 3X3 NADH (GAUZE/BANDAGES/DRESSINGS) IMPLANT
DRSG PAD ABDOMINAL 8X10 ST (GAUZE/BANDAGES/DRESSINGS) IMPLANT
DURAPREP 26ML APPLICATOR (WOUND CARE) IMPLANT
ELECT REM PT RETURN 9FT ADLT (ELECTROSURGICAL)
ELECTRODE REM PT RTRN 9FT ADLT (ELECTROSURGICAL) IMPLANT
FACESHIELD LNG OPTICON STERILE (SAFETY) IMPLANT
GLOVE BIOGEL PI ORTHO PRO 7.5 (GLOVE)
GLOVE BIOGEL PI ORTHO PRO SZ8 (GLOVE)
GLOVE ORTHO TXT STRL SZ7.5 (GLOVE) IMPLANT
GLOVE PI ORTHO PRO STRL 7.5 (GLOVE) IMPLANT
GLOVE PI ORTHO PRO STRL SZ8 (GLOVE) IMPLANT
GLOVE SURG ORTHO 8.5 STRL (GLOVE) IMPLANT
GOWN STRL NON-REIN LRG LVL3 (GOWN DISPOSABLE) IMPLANT
GOWN STRL REIN XL XLG (GOWN DISPOSABLE) IMPLANT
KIT BASIN OR (CUSTOM PROCEDURE TRAY) IMPLANT
KIT ROOM TURNOVER OR (KITS) ×3 IMPLANT
MANIFOLD NEPTUNE II (INSTRUMENTS) IMPLANT
NDL SUT .5 MAYO 1.404X.05X (NEEDLE) IMPLANT
NDL SUT 6 .5 CRC .975X.05 MAYO (NEEDLE) IMPLANT
NEEDLE HYPO 25GX1X1/2 BEV (NEEDLE) IMPLANT
NEEDLE MAYO TAPER (NEEDLE)
NS IRRIG 1000ML POUR BTL (IV SOLUTION) IMPLANT
PACK SHOULDER (CUSTOM PROCEDURE TRAY) IMPLANT
PAD ARMBOARD 7.5X6 YLW CONV (MISCELLANEOUS) IMPLANT
SLING ARM IMMOBILIZER LRG (SOFTGOODS) ×3 IMPLANT
SPONGE GAUZE 4X4 12PLY (GAUZE/BANDAGES/DRESSINGS) IMPLANT
SPONGE LAP 4X18 X RAY DECT (DISPOSABLE) IMPLANT
STRIP CLOSURE SKIN 1/2X4 (GAUZE/BANDAGES/DRESSINGS) IMPLANT
SUT MNCRL AB 3-0 PS2 18 (SUTURE) IMPLANT
SUT VIC AB 2-0 CT1 27 (SUTURE)
SUT VIC AB 2-0 CT1 TAPERPNT 27 (SUTURE) IMPLANT
SYR CONTROL 10ML LL (SYRINGE) IMPLANT
TOWEL OR 17X24 6PK STRL BLUE (TOWEL DISPOSABLE) IMPLANT
TOWEL OR 17X26 10 PK STRL BLUE (TOWEL DISPOSABLE) IMPLANT
WATER STERILE IRR 1000ML POUR (IV SOLUTION) IMPLANT
YANKAUER SUCT BULB TIP NO VENT (SUCTIONS) IMPLANT

## 2013-02-27 NOTE — ED Notes (Signed)
Ortho MD, Dr. Victorino Dike at bedside.

## 2013-02-27 NOTE — ED Notes (Signed)
Consent for OR obtained.

## 2013-02-27 NOTE — H&P (Signed)
Craig Myers is an 47 y.o. male.   Chief Complaint: left shoulder pain HPI: 47 y/o male with PMH of anxiety had a seizure this morning injuring his left shoulder.  He has a h/o one previous left shoulder dislocation two years ago with a seizure.  He takes xanax for anxiety, and both seizures have been triggered by acutely stopping the xanax.  He denies any numbness, tingling or weakness in the left upper extremity.  No smoking.  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  He c/o aching pain in the left shoulder that is worse with any attempt at motion and better with rest.  Pt has failed an attempt at closed reduction in the er.  Past Medical History  Diagnosis Date  . Hypertension     hx/o borderline, controlled currently 6/12  . Epileptic   . PTSD (post-traumatic stress disorder)   . Anxiety   . Pulmonary embolism     Past Surgical History  Procedure Laterality Date  . Appendectomy    . Hip surgery  2000  . Doppler echocardiography  08/2008    mild LV thickening, EF 66%    No family history on file. Social History:  reports that he has never smoked. He has never used smokeless tobacco. He reports that he drinks about 1.0 ounces of alcohol per week. He reports that he does not use illicit drugs.  Allergies:  Allergies  Allergen Reactions  . Codeine     unknown     (Not in a hospital admission)  No results found for this or any previous visit (from the past 48 hour(s)). Dg Shoulder Left  02/27/2013  *RADIOLOGY REPORT*  Clinical Data: History of seizures, left shoulder pain  LEFT SHOULDER - 2+ VIEW  Comparison: Left shoulder films of 11/25/2011  Findings: There is anterior dislocation of the left humeral head. Also, a fracture fragment possibly from the glenoid rim cannot be excluded.  Degenerative change is noted at the left [facility] joint.  IMPRESSION: Anterior dislocation with questionable fracture possibly of the glenoid rim.   Original Report Authenticated By:  Dwyane Dee, M.D.    Dg Shoulder Left Port  02/27/2013  *RADIOLOGY REPORT*  Clinical Data: Status post reduction  PORTABLE LEFT SHOULDER - 2+ VIEW  Comparison: 02/27/2013, 1102 hours  Findings: The humeral head remains inferiorly and anteriorly dislocated.  The bony density adjacent to the glenoid is again identified suspicious for a glenoid fracture.  No other focal abnormality is noted.  IMPRESSION: Persistent dislocation of the left shoulder.   Original Report Authenticated By: Alcide Clever, M.D.     ROS  No recent f/c/n/wt loss.  Blood pressure 115/85, pulse 88, temperature 97.3 F (36.3 C), temperature source Oral, resp. rate 20, height 6\' 3"  (1.905 m), weight 94.348 kg (208 lb), SpO2 100.00%. Physical Exam  wn wd muscular male in nad.  A and O.  Mood and affect normal.  EOMI.  Resp unlabored.  L shoulder with anterior swelling.  TTP atneriorly.  NTTP at [facility] joint.  Skin healthy and intact.  No lymphadenopathy.  2+ radial and ulnar pulses.  Normal motor and sensory function at radial, ulnar, median and axillary n distributions.  5/5 strength in grip.  Assessment/Plan Left shoulder anterior dislocation - to OR for closed v. Open reduction under anesthesia.  The risks and benefits of the alternative treatment options have been discussed in detail.  The patient wishes to proceed with surgery and specifically understands risks  of bleeding, infection, nerve damage, blood clots, need for additional surgery, amputation and death.   Toni Arthurs Mar 06, 2013, 4:13 PM

## 2013-02-27 NOTE — ED Provider Notes (Signed)
Medical screening examination/treatment/procedure(s) were conducted as a shared visit with non-physician practitioner(s) and myself.  I personally evaluated the patient during the encounter  We attempted  procedural sedation to reduce the patient's shoulder dislocation. We had a very difficult time achieving adequate sedation. I think this may be related to the fact that the patient takes Xanax 2 mg twice daily. He was given 200 mg of propofol as well and 50 mcg of fentanyl without adequate response.  Patient may require consultation with anesthesia to achieve more appropriate level of sedation.  Discussed case with nurse.  Dr Victorino Dike is in the OR.  She will have him come down after he is done with his case.  Celene Kras, MD 02/27/13 [phone]

## 2013-02-27 NOTE — Anesthesia Postprocedure Evaluation (Signed)
  Anesthesia Post-op Note  Patient: Craig Myers  Procedure(s) Performed: Procedure(s): CLOSED REDUCTION SHOULDER (Left)  Patient Location: PACU  Anesthesia Type:General  Level of Consciousness: awake, alert  and oriented  Airway and Oxygen Therapy: Patient Spontanous Breathing  Post-op Pain: none  Post-op Assessment: Post-op Vital signs reviewed, Patient's Cardiovascular Status Stable, Respiratory Function Stable, Patent Airway and Pain level controlled  Post-op Vital Signs: stable  Complications: No apparent anesthesia complications

## 2013-02-27 NOTE — ED Provider Notes (Signed)
History     CSN: [id number]  Arrival date & time 02/27/13  4098   First MD Initiated Contact with Patient 02/27/13 1022      Chief Complaint  Patient presents with  . Seizures    (Consider location/radiation/quality/duration/timing/severity/associated sxs/prior treatment) HPI  47 year old male presents for evaluations of seizure. Patient has a history of PTSD, and has been on antianxiety medication, Xanax, for many years. He takes 2 mg of Xanax by mouth 2 times daily. He ran out of his medication for the past 3 or 4 days. Since being without of his medication, patient has felt anxious, unable to sleep, and he presents to his doctor's office today for medication refill. While in the waiting room he had an apparent witnessed seizure episode, followed with postictal state. Patient fell off the chair landed on his left shoulder and states he thinks he may have a L shoulder dislocation. He has had same shoulder dislocation  in the past. He complained of sharp throbbing pain to his shoulder, non radiating, 10 out of 10, worsening with movement. No associated numbness. No tongue biting or urinary incontinence. Patient denies any other changes prior to this seizure episode. No recent sickness, or medication changes. He has had a similar seizure episode last year when he ran out of his Xanax. It was thought that is likely due to benzo withdrawal. Patient denies any recent alcohol abuse or recreational drug abuse.  Past Medical History  Diagnosis Date  . Hypertension     hx/o borderline, controlled currently 6/12  . Epileptic   . PTSD (post-traumatic stress disorder)   . Anxiety   . Pulmonary embolism     Past Surgical History  Procedure Laterality Date  . Appendectomy    . Hip surgery  2000  . Doppler echocardiography  08/2008    mild LV thickening, EF 66%    No family history on file.  History  Substance Use Topics  . Smoking status: Never Smoker   . Smokeless tobacco: Never Used  .  Alcohol Use: 1.0 oz/week    2 drink(s) per week      Review of Systems  Constitutional:       A complete 10 system review of systems was obtained and all systems are negative except as noted in the HPI and PMH.    Allergies  Codeine  Home Medications   Current Outpatient Rx  Name  Route  Sig  Dispense  Refill  . alprazolam (XANAX) 2 MG tablet   Oral   Take 2 mg by mouth 2 (two) times daily.         Marland Kitchen amLODipine (NORVASC) 5 MG tablet   Oral   Take 5 mg by mouth daily.         Marland Kitchen losartan-hydrochlorothiazide (HYZAAR) 100-12.5 MG per tablet   Oral   Take 1 tablet by mouth daily.         . valACYclovir (VALTREX) 1000 MG tablet   Oral   Take 1,000 mg by mouth daily.           SpO2 97%  Physical Exam  Nursing note and vitals reviewed. Constitutional: He appears well-developed and well-nourished. No distress.  Awake, alert, nontoxic appearance  HENT:  Head: Normocephalic and atraumatic.  No tongue biting  Eyes: Conjunctivae and EOM are normal. Pupils are equal, round, and reactive to light. Right eye exhibits no discharge. Left eye exhibits no discharge.  Neck: Normal range of motion. Neck supple.  Cardiovascular: Normal  rate and regular rhythm.  Exam reveals no gallop and no friction rub.   No murmur heard. Pulmonary/Chest: Effort normal. No respiratory distress. He exhibits no tenderness.  Abdominal: Soft. There is no tenderness. There is no rebound.  Musculoskeletal: He exhibits tenderness (L shoulder: evidence of L shoulder anterior dislocation with decreased ROM due to pain, but no associated paresthesia or overlying skin changes.  Normal grip, radial pulse 2+.). He exhibits no edema.  ROM appears intact, no obvious focal weakness  Neurological: He is alert.  Skin: Skin is warm and dry. No rash noted.  Psychiatric: He has a normal mood and affect.    ED Course  Reduction of dislocation Date/Time: 02/27/2013 1:12 PM Performed by: Fayrene Helper Authorized  by: Fayrene Helper Consent: Verbal consent obtained. written consent obtained. Risks and benefits: risks, benefits and alternatives were discussed Consent given by: patient Patient understanding: patient states understanding of the procedure being performed Patient consent: the patient's understanding of the procedure matches consent given Procedure consent: procedure consent matches procedure scheduled Relevant documents: relevant documents present and verified Site marked: the operative site was marked Imaging studies: imaging studies available Patient identity confirmed: verbally with patient and arm band Time out: Immediately prior to procedure a "time out" was called to verify the correct patient, procedure, equipment, support staff and site/side marked as required. Local anesthesia used: no Patient sedated: yes Sedatives: propofol Analgesia: fentanyl Sedation start date/time: 02/27/2013 1:28 PM Sedation end date/time: 02/27/2013 1:53 PM Vitals: Vital signs were monitored during sedation. Comments: Relocation of L shoulder using procedural sedation.  Dr. Linwood Dibbles supervised.  Nursing staff and nurse tech assisting.  Propofol given (200mg ) along with Fentanyl administer without adequate sedation.  Reduction attempt using traction/counter traction technique without success.     (including critical care time)    10:41 AM Patient with recent seizure episode, likely secondary to benzo withdrawal. He also had left shoulder injury, likely dislocation from the recent fall. Patient requests to have conscious sedation for shoulder reduction.  1:47 PM Xray shows L anterior shoulder dislocation.  We attempt to reduce using procedural sedation under the monitor of Dr. Lynelle Doctor, however unsuccessful. We had a very difficult time achieving adequate sedation. we think this may be related to the fact that the patient takes Xanax 2 mg twice daily. He was given 200 mg of propofol as well and 50 mcg of  fentanyl without adequate response. Patient may require consultation with anesthesia to achieve more appropriate level of sedation.  2:22 PM Unsuccessful reduction of L shoulder as evident by post reduction xray.  Dr. Lynelle Doctor has consulted orthopedic, Dr. Victorino Dike, who will see and perform L shoulder reduction.  Pt is currently pain controlled and is mentating well.    3:27 PM Dr. Victorino Dike will evaluate patient and will bring pt to OR for shoulder reduction.  Pt will be discharge after reduction.  He will need to follow up with his PCP for refill of his antianxiety medication, Xanax.  Pt is cleared on a medical stand point.  My attending agrees with plan.    Labs Reviewed - No data to display Dg Shoulder Left  02/27/2013  *RADIOLOGY REPORT*  Clinical Data: History of seizures, left shoulder pain  LEFT SHOULDER - 2+ VIEW  Comparison: Left shoulder films of 11/25/2011  Findings: There is anterior dislocation of the left humeral head. Also, a fracture fragment possibly from the glenoid rim cannot be excluded.  Degenerative change is noted at the left [facility] joint.  IMPRESSION: Anterior dislocation with questionable fracture possibly of the glenoid rim.   Original Report Authenticated By: Dwyane Dee, M.D.    Dg Shoulder Left Port  02/27/2013  *RADIOLOGY REPORT*  Clinical Data: Status post reduction  PORTABLE LEFT SHOULDER - 2+ VIEW  Comparison: 02/27/2013, 1102 hours  Findings: The humeral head remains inferiorly and anteriorly dislocated.  The bony density adjacent to the glenoid is again identified suspicious for a glenoid fracture.  No other focal abnormality is noted.  IMPRESSION: Persistent dislocation of the left shoulder.   Original Report Authenticated By: Alcide Clever, M.D.      1. Secondary seizure disorder   2. Shoulder dislocation, left, initial encounter       MDM  BP 115/85  Pulse 88  Temp(Src) 97.3 F (36.3 C) (Oral)  Resp 20  Ht 6\' 3"  (1.905 m)  Wt 208 lb (94.348 kg)  BMI 26 kg/m2   SpO2 100%  I have reviewed nursing notes and vital signs. I personally reviewed the imaging tests through PACS system  I reviewed available ER/hospitalization records thought the EMR         Fayrene Helper, [place] 02/27/13 1528

## 2013-02-27 NOTE — Preoperative (Signed)
Beta Blockers   Reason not to administer Beta Blockers:Not Applicable 

## 2013-02-27 NOTE — Brief Op Note (Signed)
02/27/2013  5:01 PM  PATIENT:  Craig Myers  47 y.o. male  PRE-OPERATIVE DIAGNOSIS:  Left anterior shoulder dislocation   POST-OPERATIVE DIAGNOSIS:  Left anterior shoulder dislocation and traumatic Bankart lesion  Procedure(s): Closed reduction of left shoulder dislocation under anesthesia  SURGEON:  Toni Arthurs, MD  ASSISTANT: n/a  ANESTHESIA:   General  EBL:  none  TOURNIQUET:  n/a  COMPLICATIONS:  None apparent  DISPOSITION:  Extubated, awake and stable to recovery.  DICTATION ID:  [id number]

## 2013-02-27 NOTE — ED Notes (Addendum)
Was at MD's office & had a witnessed seizure lasting approx 5 min. Pt fell from a chair, c/o left shoulder pain. Post ictal upon EMS arrival, now A&OX4 upon arrival to ED. Given total of fentanyl enroute

## 2013-02-27 NOTE — ED Notes (Signed)
No oral trauma or incontinence noted.

## 2013-02-27 NOTE — Transfer of Care (Signed)
Immediate Anesthesia Transfer of Care Note  Patient: Craig Myers  Procedure(s) Performed: Procedure(s): CLOSED REDUCTION SHOULDER (Left)  Patient Location: PACU  Anesthesia Type:General  Level of Consciousness: awake, alert , oriented and sedated  Airway & Oxygen Therapy: Patient Spontanous Breathing and Patient connected to nasal cannula oxygen  Post-op Assessment: Report given to PACU RN, Post -op Vital signs reviewed and stable and Patient moving all extremities  Post vital signs: Reviewed and stable  Complications: No apparent anesthesia complications

## 2013-02-27 NOTE — Anesthesia Preprocedure Evaluation (Signed)
Anesthesia Evaluation  Patient identified by MRN, date of birth, ID band Patient awake    Reviewed: Allergy & Precautions, H&P , NPO status , Patient's Chart, lab work & pertinent test results  Airway Mallampati: II      Dental  (+) Teeth Intact and Dental Advisory Given   Pulmonary  breath sounds clear to auscultation        Cardiovascular Rhythm:Regular Rate:Normal     Neuro/Psych    GI/Hepatic   Endo/Other    Renal/GU      Musculoskeletal   Abdominal   Peds  Hematology   Anesthesia Other Findings   Reproductive/Obstetrics                           Anesthesia Physical Anesthesia Plan  ASA: III  Anesthesia Plan: General   Post-op Pain Management:    Induction: Intravenous  Airway Management Planned: LMA  Additional Equipment:   Intra-op Plan:   Post-operative Plan:   Informed Consent: I have reviewed the patients History and Physical, chart, labs and discussed the procedure including the risks, benefits and alternatives for the proposed anesthesia with the patient or authorized representative who has indicated his/her understanding and acceptance.   Dental advisory given  Plan Discussed with: CRNA and Surgeon  Anesthesia Plan Comments:         Anesthesia Quick Evaluation

## 2013-02-28 NOTE — Op Note (Signed)
NAMEHEBER, HOOG NO.:  [id number]  MEDICAL RECORD NO.:  [id number]  LOCATION:  [facility]                         FACILITY:  [facility]  PHYSICIAN:  Toni Arthurs, MD        DATE OF BIRTH:  07/13/1966  DATE OF PROCEDURE:  02/27/2013 DATE OF DISCHARGE:  02/27/2013                              OPERATIVE REPORT   PREOPERATIVE DIAGNOSIS:  Left shoulder dislocation.  POSTOPERATIVE DIAGNOSIS:  Left shoulder dislocation.  PROCEDURE:  Closed reduction of left shoulder dislocation under anesthesia.  SURGEON:  Toni Arthurs, MD.  ANESTHESIA:  General.  ESTIMATED BLOOD LOSS:  Zero.  COMPLICATIONS:  None apparent.  DISPOSITION:  Extubated awake and stable to recovery.  INDICATIONS FOR PROCEDURE:  The patient is a 47 year old male who had a seizure earlier today injuring his left shoulder.  He is not sure what happened during the seizure or in the process of falling afterwards.  He had an attempt at closed reduction by the emergency department staff. This was unsuccessful.  I was consulted for further evaluation and treatment of this injury.  The patient presents now for closed reduction versus open reduction of a left shoulder dislocation under anesthesia. He understands the risks and benefits, the alternative treatment options and elects surgical treatment.  He specifically understands risks of bleeding, infection, nerve damage, blood clots, need for additional surgery, amputation, and death.  PROCEDURE IN DETAIL:  After informed consent and identification of the correct operative site, the patient was brought to the operating room supine on a gurney.  General anesthesia was induced.  After the patient was adequately relaxed, abduction, external rotation, and gentle traction were applied on the shoulder.  It was noted to reduce posteriorly with an audible and palpable clunk.  An AP fluoroscopic image was obtained showing pair reduction of the joint and axillary lateral  was then obtained confirming a pair reduction of the joint, but also noting a bony fracture fragment anterior to the joint.  This appears to be a bony Bankart lesion.  Prior to imaging, the shoulder was ranged and was noted to be unstable with external rotation past 60 degrees with the shoulder abducted 90 degrees.  The shoulder was noted to dislocate in that position and so it was re-reduced.  Again, the radiographs confirmed appropriate reduction of the dislocation.  A sling and swathe were then applied.  The patient was then awakened from anesthesia and transported to recovery room in stable condition.  FOLLOWUP PLAN:  The patient will keep the sling and swathe except to shower in which case he will leave his arms by his side.  He will follow up with Dr. Ranell Patrick Tuesday morning in clinic for further evaluation of this lesion.  He will get followup x-rays in the recovery room.     Toni Arthurs, MD     JH/MEDQ  D:  02/27/2013  T:  02/28/2013  Job:  [id number]

## 2013-03-02 ENCOUNTER — Encounter (HOSPITAL_COMMUNITY): Payer: Self-pay | Admitting: Orthopedic Surgery

## 2013-03-08 ENCOUNTER — Emergency Department (HOSPITAL_COMMUNITY): Payer: Managed Care, Other (non HMO)

## 2013-03-08 ENCOUNTER — Emergency Department (HOSPITAL_COMMUNITY)
Admission: EM | Admit: 2013-03-08 | Discharge: 2013-03-08 | Disposition: A | Discharge status: Home / Self Care | Payer: Managed Care, Other (non HMO) | Attending: Emergency Medicine | Admitting: Emergency Medicine

## 2013-03-08 ENCOUNTER — Encounter (HOSPITAL_COMMUNITY): Payer: Self-pay | Admitting: Family Medicine

## 2013-03-08 DIAGNOSIS — S43005A Unspecified dislocation of left shoulder joint, initial encounter: Secondary | ICD-10-CM

## 2013-03-08 DIAGNOSIS — Y939 Activity, unspecified: Secondary | ICD-10-CM | POA: Insufficient documentation

## 2013-03-08 DIAGNOSIS — F4310 Post-traumatic stress disorder, unspecified: Secondary | ICD-10-CM | POA: Insufficient documentation

## 2013-03-08 DIAGNOSIS — I10 Essential (primary) hypertension: Secondary | ICD-10-CM | POA: Insufficient documentation

## 2013-03-08 DIAGNOSIS — S43006A Unspecified dislocation of unspecified shoulder joint, initial encounter: Secondary | ICD-10-CM | POA: Insufficient documentation

## 2013-03-08 DIAGNOSIS — Z79899 Other long term (current) drug therapy: Secondary | ICD-10-CM | POA: Insufficient documentation

## 2013-03-08 DIAGNOSIS — Y929 Unspecified place or not applicable: Secondary | ICD-10-CM | POA: Insufficient documentation

## 2013-03-08 DIAGNOSIS — Z8669 Personal history of other diseases of the nervous system and sense organs: Secondary | ICD-10-CM | POA: Insufficient documentation

## 2013-03-08 DIAGNOSIS — Z86711 Personal history of pulmonary embolism: Secondary | ICD-10-CM | POA: Insufficient documentation

## 2013-03-08 DIAGNOSIS — F411 Generalized anxiety disorder: Secondary | ICD-10-CM | POA: Insufficient documentation

## 2013-03-08 DIAGNOSIS — X58XXXA Exposure to other specified factors, initial encounter: Secondary | ICD-10-CM | POA: Insufficient documentation

## 2013-03-08 MED ORDER — FENTANYL CITRATE 0.05 MG/ML IJ SOLN
50.0000 ug | Freq: Once | INTRAMUSCULAR | Status: AC
  Administered 2013-03-08: 50 ug via INTRAVENOUS

## 2013-03-08 MED ORDER — PROPOFOL 10 MG/ML IV BOLUS
INTRAVENOUS | Status: AC | PRN
  Administered 2013-03-08: 10 mg via INTRAVENOUS

## 2013-03-08 MED ORDER — PROPOFOL 10 MG/ML IV BOLUS
0.5000 mg/kg | Freq: Once | INTRAVENOUS | Status: AC
  Administered 2013-03-08: 5 mg via INTRAVENOUS
  Filled 2013-03-08: qty 20

## 2013-03-08 MED ORDER — ONDANSETRON HCL 4 MG/2ML IJ SOLN
4.0000 mg | Freq: Once | INTRAMUSCULAR | Status: AC
  Administered 2013-03-08: 4 mg via INTRAVENOUS
  Filled 2013-03-08: qty 2

## 2013-03-08 MED ORDER — HYDROMORPHONE HCL PF 2 MG/ML IJ SOLN
2.0000 mg | Freq: Once | INTRAMUSCULAR | Status: AC
  Administered 2013-03-08: 2 mg via INTRAVENOUS
  Filled 2013-03-08: qty 1

## 2013-03-08 MED ORDER — FENTANYL CITRATE 0.05 MG/ML IJ SOLN
INTRAMUSCULAR | Status: AC
  Filled 2013-03-08: qty 2

## 2013-03-08 MED ORDER — HYDROCODONE-ACETAMINOPHEN 5-325 MG PO TABS: 2.0000 | ORAL_TABLET | ORAL | Status: DC | PRN

## 2013-03-08 NOTE — ED Notes (Signed)
Pt in excruciating pain, grunting, and very uncomfortable.

## 2013-03-08 NOTE — Progress Notes (Signed)
Orthopedic Tech Progress Note Patient Details:  Craig Myers May 26, 1966 [id number]  Ortho Devices Type of Ortho Device: Sling immobilizer Ortho Device/Splint Location: left arm Ortho Device/Splint Interventions: Application Shoulder reduction  Nikki Dom 03/08/2013, 3:32 PM

## 2013-03-08 NOTE — ED Notes (Signed)
Called xray to check on delay

## 2013-03-08 NOTE — ED Notes (Signed)
Total of 15mg  of Propofol given.

## 2013-03-08 NOTE — ED Notes (Signed)
Pt alert sats good.  Sl drowsy still

## 2013-03-08 NOTE — ED Notes (Signed)
The pt has his shoulder immobilizer in place alert talking friend at the bedside

## 2013-03-08 NOTE — ED Notes (Signed)
Per pt sts shoulder dislocation about 5 hours ago. sts severe pain. Pt is scheduled for surgery next week.

## 2013-03-08 NOTE — ED Notes (Signed)
The pt is awake and talking and wants to go home sats good bp good

## 2013-03-08 NOTE — ED Provider Notes (Signed)
History     CSN: [id number]  Arrival date & time 03/08/13  1307   First MD Initiated Contact with Patient 03/08/13 1428      Chief Complaint  Patient presents with  . Shoulder Injury    (Consider location/radiation/quality/duration/timing/severity/associated sxs/prior treatment) HPI Comments: Patient comes to the ER for evaluation of shoulder dislocation. Patient reports that he has had previous shoulder dislocations. Patient reports that this occurred a number of hours ago. Initially the pain was not very severe, but it has become much more severe. Patient indicates severe pain in the left shoulder which worsens with any minimal movement. He's not sure exactly how the shoulder became dislocated. He thinks it was just a minimal movement.  Patient is a 47 y.o. male presenting with shoulder injury.  Shoulder Injury    Past Medical History  Diagnosis Date  . Hypertension     hx/o borderline, controlled currently 6/12  . Epileptic   . PTSD (post-traumatic stress disorder)   . Anxiety   . Pulmonary embolism     Past Surgical History  Procedure Laterality Date  . Appendectomy    . Hip surgery  2000  . Doppler echocardiography  08/2008    mild LV thickening, EF 66%  . Shoulder closed reduction Left 02/27/2013    Procedure: CLOSED REDUCTION SHOULDER;  Surgeon: Toni Arthurs, MD;  Location: [facility] OR;  Service: Orthopedics;  Laterality: Left;    History reviewed. No pertinent family history.  History  Substance Use Topics  . Smoking status: Never Smoker   . Smokeless tobacco: Never Used  . Alcohol Use: 1.0 oz/week    2 drink(s) per week      Review of Systems  Musculoskeletal:       Left shoulder pain     Allergies  Codeine  Home Medications   Current Outpatient Rx  Name  Route  Sig  Dispense  Refill  . alprazolam (XANAX) 2 MG tablet      1/2 tablet po q12h   3 tablet   0   . HYDROcodone-acetaminophen (NORCO) 5-325 MG per tablet   Oral   Take 1 tablet by  mouth every 6 (six) hours as needed for pain.   20 tablet   0   . lisinopril (PRINIVIL,ZESTRIL) 20 MG tablet   Oral   Take 20 mg by mouth daily.         . metoprolol-hydrochlorothiazide (LOPRESSOR HCT) 50-25 MG per tablet   Oral   Take 1 tablet by mouth daily.           BP 133/91  Pulse 87  Temp(Src) 97.9 F (36.6 C) (Oral)  Resp 16  SpO2 100%  Physical Exam  Constitutional: He is oriented to person, place, and time. He appears well-developed and well-nourished. He appears distressed.  HENT:  Head: Normocephalic and atraumatic.  Right Ear: Hearing normal.  Nose: Nose normal.  Mouth/Throat: Oropharynx is clear and moist and mucous membranes are normal.  Eyes: Conjunctivae and EOM are normal. Pupils are equal, round, and reactive to light.  Neck: Normal range of motion. Neck supple.  Cardiovascular: Normal rate, regular rhythm, S1 normal and S2 normal.  Exam reveals no gallop and no friction rub.   No murmur heard. Pulmonary/Chest: Effort normal and breath sounds normal. No respiratory distress. He exhibits no tenderness.  Abdominal: Soft. Normal appearance and bowel sounds are normal. There is no hepatosplenomegaly. There is no tenderness. There is no rebound, no guarding, no tenderness at McBurney's point and  negative Murphy's sign. No hernia.  Musculoskeletal:       Left shoulder: He exhibits decreased range of motion, tenderness, bony tenderness, deformity and spasm. He exhibits normal pulse.  Neurological: He is alert and oriented to person, place, and time. He has normal strength. No cranial nerve deficit or sensory deficit. Coordination normal. GCS eye subscore is 4. GCS verbal subscore is 5. GCS motor subscore is 6.  Skin: Skin is warm, dry and intact. No rash noted. No cyanosis.  Psychiatric: He has a normal mood and affect. His speech is normal and behavior is normal. Thought content normal.    ED Course  Procedures (including critical care time)  Procedure:  Conscious Sedation Patient was placed on suplemental oxygen as well as continuous cardiac and pulse oximetry monitoring. Patient was administered medications under direct supervision by myself. Excellent sedation was achieved, although multiple administrations for needed. Patient's vital signs remained stable. The patient was continuously monitored during the recovery phase. The patient tolerated the sedation without complication.  Procedure: Left shoulder reduction (closed) This was placed in the supine position. After adequate sedation was achieved, manipulation was utilized to reduce the shoulder. Patient showed was held flexed at 90 and externally rotated. The shoulder joint was then abducted. With multiple similar manipulations back-and-forth, the joint did reduce. Patient tolerated this procedure well. There were no complications. Post reduction x-ray showed reduction without fracture. Evaluation reveals normal neurovascular status, including normal sensation over the deltoid.  Labs Reviewed - No data to display Dg Shoulder Left  03/08/2013  *RADIOLOGY REPORT*  Clinical Data: Dislocated left shoulder  LEFT SHOULDER - 2+ VIEW  Comparison: 02/27/2013  Findings: Anterior inferior left shoulder dislocation.  Degenerative changes of the acromioclavicular joint.  No fracture is seen.  Visualized left lung is clear.  IMPRESSION: Anterior inferior left shoulder dislocation.   Original Report Authenticated By: Charline Bills, M.D.      Diagnosis: Shoulder dislocation    MDM  Comes to the ER for evaluation of left shoulder pain. Patient has a history of recurrent dislocation of the left shoulder. Patient reports that he is scheduled for surgery next week. X-ray did confirm anterior dislocation. Patient underwent closed reduction here in the ER after discussed risks and benefits and he did verbally consent. Patient had successful reduction of the shoulder. Post an immobilizer, will maintain  immobilization until surgery.        Gilda Crease, MD 03/08/13 [phone]

## 2013-04-21 ENCOUNTER — Other Ambulatory Visit (HOSPITAL_COMMUNITY): Payer: Self-pay | Admitting: Orthopedic Surgery

## 2013-04-21 DIAGNOSIS — M545 Low back pain, unspecified: Secondary | ICD-10-CM

## 2013-04-24 ENCOUNTER — Ambulatory Visit (HOSPITAL_COMMUNITY)
Admission: RE | Admit: 2013-04-24 | Discharge: 2013-04-24 | Disposition: A | Discharge status: Home / Self Care | Payer: Managed Care, Other (non HMO) | Source: Ambulatory Visit | Attending: Orthopedic Surgery | Admitting: Orthopedic Surgery

## 2013-04-24 DIAGNOSIS — M545 Low back pain: Secondary | ICD-10-CM

## 2013-04-24 DIAGNOSIS — M5126 Other intervertebral disc displacement, lumbar region: Secondary | ICD-10-CM | POA: Insufficient documentation

## 2013-05-09 ENCOUNTER — Emergency Department (HOSPITAL_COMMUNITY)
Admission: EM | Admit: 2013-05-09 | Discharge: 2013-05-09 | Disposition: A | Discharge status: Home / Self Care | Payer: Managed Care, Other (non HMO) | Attending: Emergency Medicine | Admitting: Emergency Medicine

## 2013-05-09 ENCOUNTER — Encounter (HOSPITAL_COMMUNITY): Payer: Self-pay | Admitting: Family Medicine

## 2013-05-09 DIAGNOSIS — Z79899 Other long term (current) drug therapy: Secondary | ICD-10-CM | POA: Insufficient documentation

## 2013-05-09 DIAGNOSIS — Z8669 Personal history of other diseases of the nervous system and sense organs: Secondary | ICD-10-CM | POA: Insufficient documentation

## 2013-05-09 DIAGNOSIS — Z8659 Personal history of other mental and behavioral disorders: Secondary | ICD-10-CM | POA: Insufficient documentation

## 2013-05-09 DIAGNOSIS — I10 Essential (primary) hypertension: Secondary | ICD-10-CM | POA: Insufficient documentation

## 2013-05-09 DIAGNOSIS — R110 Nausea: Secondary | ICD-10-CM | POA: Insufficient documentation

## 2013-05-09 DIAGNOSIS — R51 Headache: Secondary | ICD-10-CM | POA: Insufficient documentation

## 2013-05-09 DIAGNOSIS — F411 Generalized anxiety disorder: Secondary | ICD-10-CM | POA: Insufficient documentation

## 2013-05-09 DIAGNOSIS — Z8673 Personal history of transient ischemic attack (TIA), and cerebral infarction without residual deficits: Secondary | ICD-10-CM | POA: Insufficient documentation

## 2013-05-09 NOTE — ED Notes (Signed)
Per pt migraine that started yesterday and took ibuprofen that helped and then woke up this morning with stiff neck and severe HA and neck pain.

## 2013-05-09 NOTE — ED Provider Notes (Addendum)
History     CSN: [id number]  Arrival date & time 05/09/13  1721   None     Chief Complaint  Patient presents with  . Headache    (Consider location/radiation/quality/duration/timing/severity/associated sxs/prior treatment) HPI Complains of bitemporal headache gradual onset yesterday afternoon. This morning he awakened with neck stiffness which has improved with time. Headache has also improved with time since taking Advil this morning. Symptoms accompanied by mouth nausea no fever no rash no photophobia no other complaint. He feels much improved presently over earlier today.  . Past Medical History  Diagnosis Date  . Hypertension     hx/o borderline, controlled currently 6/12  . Epileptic   . PTSD (post-traumatic stress disorder)   . Anxiety   . Pulmonary embolism     Past Surgical History  Procedure Laterality Date  . Appendectomy    . Hip surgery  2000  . Doppler echocardiography  08/2008    mild LV thickening, EF 66%  . Shoulder closed reduction Left 02/27/2013    Procedure: CLOSED REDUCTION SHOULDER;  Surgeon: Toni Arthurs, MD;  Location: [facility] OR;  Service: Orthopedics;  Laterality: Left;    History reviewed. No pertinent family history.  History  Substance Use Topics  . Smoking status: Never Smoker   . Smokeless tobacco: Never Used  . Alcohol Use: 1.0 oz/week    2 drink(s) per week      Review of Systems  Constitutional: Negative.   Respiratory: Negative.   Cardiovascular: Negative.   Gastrointestinal: Positive for nausea.  Musculoskeletal: Negative.   Skin: Negative.   Neurological: Positive for headaches.  Psychiatric/Behavioral: Negative.   All other systems reviewed and are negative.    Allergies  Codeine  Home Medications   Current Outpatient Rx  Name  Route  Sig  Dispense  Refill  . alprazolam (XANAX) 2 MG tablet   Oral   Take 0.25 mg by mouth at bedtime as needed for sleep.         Marland Kitchen HYDROcodone-acetaminophen (NORCO/VICODIN) 5-325 MG  per tablet   Oral   Take 2 tablets by mouth every 4 (four) hours as needed for pain.   15 tablet   0   . lisinopril (PRINIVIL,ZESTRIL) 20 MG tablet   Oral   Take 20 mg by mouth daily.           BP 146/94  Pulse 125  Temp(Src) 98.6 F (37 C) (Oral)  Resp 24  SpO2 100%  Physical Exam  Nursing note and vitals reviewed. Constitutional: He is oriented to person, place, and time. He appears well-developed and well-nourished.  HENT:  Head: Normocephalic and atraumatic.  Eyes: Conjunctivae are normal. Pupils are equal, round, and reactive to light.  Neck: Neck supple. No tracheal deviation present. No thyromegaly present.  Cardiovascular: Normal rate and regular rhythm.   No murmur heard. Heart rate counted 80 by me  Pulmonary/Chest: Effort normal and breath sounds normal.  Abdominal: Soft. Bowel sounds are normal. He exhibits no distension. There is no tenderness.  Musculoskeletal: Normal range of motion. He exhibits no edema and no tenderness.  Neurological: He is alert and oriented to person, place, and time. Coordination normal.  Gait normal Romberg normal pronator drift normal  Skin: Skin is warm and dry. No rash noted.  Psychiatric: He has a normal mood and affect.    ED Course  Procedures (including critical care time)  Labs Reviewed - No data to display No results found.   No diagnosis found. 6:25 PM  pulse rate recounted by me manually. 84 beats per minute   MDM  Declines pain medicine. I offered CT scan the patient which he declined. I did not feel the patient needs an emergent CT scan or further diagnostic workup. Plan Advil for pain. Return or feels worse or see his primary care physician Dr. Mayford Knife Diagnosis headache        Doug Sou, MD 05/09/13 Harrietta Guardian  Doug Sou, MD 05/09/13 1610

## 2013-05-11 ENCOUNTER — Encounter (HOSPITAL_COMMUNITY): Payer: Self-pay | Admitting: Emergency Medicine

## 2013-05-11 ENCOUNTER — Emergency Department (HOSPITAL_COMMUNITY): Payer: Managed Care, Other (non HMO)

## 2013-05-11 ENCOUNTER — Emergency Department (HOSPITAL_COMMUNITY)
Admission: EM | Admit: 2013-05-11 | Discharge: 2013-05-11 | Disposition: A | Discharge status: Home / Self Care | Payer: Managed Care, Other (non HMO) | Attending: Emergency Medicine | Admitting: Emergency Medicine

## 2013-05-11 DIAGNOSIS — F411 Generalized anxiety disorder: Secondary | ICD-10-CM | POA: Insufficient documentation

## 2013-05-11 DIAGNOSIS — G971 Other reaction to spinal and lumbar puncture: Secondary | ICD-10-CM | POA: Insufficient documentation

## 2013-05-11 DIAGNOSIS — Z79899 Other long term (current) drug therapy: Secondary | ICD-10-CM | POA: Insufficient documentation

## 2013-05-11 DIAGNOSIS — Z86711 Personal history of pulmonary embolism: Secondary | ICD-10-CM | POA: Insufficient documentation

## 2013-05-11 DIAGNOSIS — Z9889 Other specified postprocedural states: Secondary | ICD-10-CM

## 2013-05-11 DIAGNOSIS — Z8659 Personal history of other mental and behavioral disorders: Secondary | ICD-10-CM | POA: Insufficient documentation

## 2013-05-11 DIAGNOSIS — G40909 Epilepsy, unspecified, not intractable, without status epilepticus: Secondary | ICD-10-CM | POA: Insufficient documentation

## 2013-05-11 DIAGNOSIS — I10 Essential (primary) hypertension: Secondary | ICD-10-CM | POA: Insufficient documentation

## 2013-05-11 LAB — CBC WITH DIFFERENTIAL/PLATELET
Basophils Absolute: 0 10*3/uL (ref 0.0–0.1)
Basophils Relative: 0 % (ref 0–1)
Eosinophils Absolute: 0.1 10*3/uL (ref 0.0–0.7)
Eosinophils Relative: 2 % (ref 0–5)
Lymphocytes Relative: 20 % (ref 12–46)
MCHC: 35 g/dL (ref 30.0–36.0)
MCV: 95.3 fL (ref 78.0–100.0)
Platelets: 159 10*3/uL (ref 150–400)
RDW: 13.9 % (ref 11.5–15.5)
WBC: 4.5 10*3/uL (ref 4.0–10.5)

## 2013-05-11 LAB — BASIC METABOLIC PANEL
Calcium: 8.7 mg/dL (ref 8.4–10.5)
GFR calc Af Amer: >90 mL/min (ref >90)
GFR calc non Af Amer: >90 mL/min (ref >90)
Sodium: 140 mEq/L (ref 135–145)

## 2013-05-11 MED ORDER — ZOLPIDEM TARTRATE 5 MG PO TABS: 5.0000 mg | ORAL_TABLET | Freq: Every evening | ORAL | Status: DC | PRN

## 2013-05-11 MED ORDER — ONDANSETRON HCL 4 MG/2ML IJ SOLN
4.0000 mg | Freq: Once | INTRAMUSCULAR | Status: AC
  Administered 2013-05-11: 4 mg via INTRAVENOUS
  Filled 2013-05-11: qty 2

## 2013-05-11 MED ORDER — SODIUM CHLORIDE 0.9 % IV BOLUS (SEPSIS)
1000.0000 mL | Freq: Once | INTRAVENOUS | Status: AC
  Administered 2013-05-11: 1000 mL via INTRAVENOUS

## 2013-05-11 MED ORDER — HYDROMORPHONE HCL PF 1 MG/ML IJ SOLN
1.0000 mg | Freq: Once | INTRAMUSCULAR | Status: AC
  Administered 2013-05-11: 1 mg via INTRAVENOUS
  Filled 2013-05-11: qty 1

## 2013-05-11 MED ORDER — OXYCODONE-ACETAMINOPHEN 5-325 MG PO TABS: 2.0000 | ORAL_TABLET | ORAL | Status: DC | PRN

## 2013-05-11 NOTE — ED Provider Notes (Signed)
History     CSN: [id number]  Arrival date & time 05/11/13  1344   First MD Initiated Contact with Patient 05/11/13 1437      Chief Complaint  Patient presents with  . Headache    (Consider location/radiation/quality/duration/timing/severity/associated sxs/prior treatment) HPI Comments: Patient presents with complaints of severe bitemporal headache for the past six days.  He feels nauseated but has not vomiting.  He also reports neck pain and stiffness when he tries to turn his head.  No fevers or chills.  No visual complaints.    He had back surgery performed one week ago by Dr. Yevette Edwards and everything seemed to go well.  He is not complaining of any significant back pain.  He was also seen here two days ago for headache and was discharged without imaging.  He is not feeling any better with ibuprofen that was recommended.  Patient is a 47 y.o. male presenting with headaches. The history is provided by the patient.  Headache Location: Bitemporal. Quality: throbbing. Radiates to:  Does not radiate Onset quality:  Gradual Duration:  5 days Timing:  Constant Progression:  Worsening Chronicity:  New Similar to prior headaches: no   Context: activity and bright light   Relieved by:  Nothing Worsened by:  Light, activity and neck movement Ineffective treatments:  NSAIDs Associated symptoms: neck pain and neck stiffness   Associated symptoms: no blurred vision, no pain and no numbness     Past Medical History  Diagnosis Date  . Hypertension     hx/o borderline, controlled currently 6/12  . Epileptic   . PTSD (post-traumatic stress disorder)   . Anxiety   . Pulmonary embolism     Past Surgical History  Procedure Laterality Date  . Appendectomy    . Hip surgery  2000  . Doppler echocardiography  08/2008    mild LV thickening, EF 66%  . Shoulder closed reduction Left 02/27/2013    Procedure: CLOSED REDUCTION SHOULDER;  Surgeon: Toni Arthurs, MD;  Location: [facility] OR;  Service:  Orthopedics;  Laterality: Left;  . Back surgery      History reviewed. No pertinent family history.  History  Substance Use Topics  . Smoking status: Never Smoker   . Smokeless tobacco: Never Used  . Alcohol Use: 1.0 oz/week    2 drink(s) per week     Comment: minimal      Review of Systems  HENT: Positive for neck pain and neck stiffness.   Eyes: Negative for blurred vision, pain and visual disturbance.  Neurological: Positive for headaches. Negative for numbness.  All other systems reviewed and are negative.    Allergies  Codeine and Tylenol  Home Medications   Current Outpatient Rx  Name  Route  Sig  Dispense  Refill  . alprazolam (XANAX) 2 MG tablet   Oral   Take 1 mg by mouth 2 (two) times daily.          Marland Kitchen ibuprofen (ADVIL,MOTRIN) 200 MG tablet   Oral   Take 200 mg by mouth every 6 (six) hours as needed for pain.         Marland Kitchen lisinopril (PRINIVIL,ZESTRIL) 20 MG tablet   Oral   Take 20 mg by mouth daily.         Marland Kitchen oxycodone (OXY-IR) 5 MG capsule   Oral   Take 5 mg by mouth every 4 (four) hours as needed for pain.         Marland Kitchen tetrahydrozoline 0.05 % ophthalmic  solution   Both Eyes   Place 2 drops into both eyes daily as needed (for dry eyes).           BP 145/105  Pulse 94  Temp(Src) 97.9 F (36.6 C) (Axillary)  Resp 22  SpO2 100%  Physical Exam  Nursing note and vitals reviewed. Constitutional: He is oriented to person, place, and time. He appears well-developed and well-nourished. No distress.  Appears uncomfortable.  HENT:  Head: Normocephalic.  Mouth/Throat: Oropharynx is clear and moist.  Eyes: EOM are normal. Pupils are equal, round, and reactive to light.  There is no papilledema upon fundoscopic exam.  Neck:  He does have significant discomfort with range of motion.  Cardiovascular: Normal rate, regular rhythm and normal heart sounds.   No murmur heard. Pulmonary/Chest: Effort normal and breath sounds normal. No respiratory  distress.  Abdominal: Soft. Bowel sounds are normal.  Musculoskeletal: Normal range of motion. He exhibits no edema.  Neurological: He is alert and oriented to person, place, and time. No cranial nerve deficit. He exhibits normal muscle tone. Coordination normal.  Skin: Skin is warm and dry. He is not diaphoretic.    ED Course  Procedures (including critical care time)  Labs Reviewed  CBC WITH DIFFERENTIAL  BASIC METABOLIC PANEL   No results found.   No diagnosis found.    MDM  The patient presents here with severe headache that started two days following surgery on his lumbar spine by Dr. Yevette Edwards.  CT of the head today was normal and the labs were unremarkable as well.  He has no fever or wbc and my suspicion for meningitis is very low.  I have spoken with Dr. Yevette Edwards who wants an mri to rule out dural leak at the surgery site.  This was performed which was suggestive of this.  The results were conveyed to Dr. Yevette Edwards who personally spoke with the patient over the phone.  He will see him in the office tomorrow to discuss the possibility of operating in the morning to repair the leak depending on how he feels tomorrow.  The patient does seem to be feeling better with the fluids and medications given.  He is requesting something to help him sleep tonight.  I wrote him for a few ambien and warned him not to take both this sleeping medication and the pain medication together.          Geoffery Lyons, MD 05/11/13 2001

## 2013-05-11 NOTE — ED Notes (Signed)
Discharge instructions reviewed. Pt verbalized understanding.  

## 2013-05-11 NOTE — ED Notes (Signed)
Pt reports 6 day hx of severe headache. States seen here on Saturday and offered CT scan of head which he declined. Pt reports he's been trying home tylenol and motrin with little relief. Pt denies recent fever. Reports neck pain and stiffness as well. Pt alert, oriented x4, MAE, in distress in triage.

## 2013-05-11 NOTE — ED Notes (Signed)
Pt states last Monday he had lower back surgery and has been having difficulty moving his neck and began having headaches last Wednesday. Pt was seen here on Saturday and was released. Pt took oxycodone for pain from surgery and headache but has had no relief. Pt rates pain 10/10.

## 2013-05-11 NOTE — ED Notes (Signed)
Dr. Delo at bedside. 

## 2013-06-01 ENCOUNTER — Other Ambulatory Visit (HOSPITAL_COMMUNITY): Payer: Self-pay | Admitting: Orthopedic Surgery

## 2013-06-01 DIAGNOSIS — M545 Low back pain: Secondary | ICD-10-CM

## 2013-06-03 ENCOUNTER — Ambulatory Visit (HOSPITAL_COMMUNITY)
Admission: RE | Admit: 2013-06-03 | Discharge: 2013-06-03 | Disposition: A | Discharge status: Home / Self Care | Payer: Managed Care, Other (non HMO) | Source: Ambulatory Visit | Attending: Orthopedic Surgery | Admitting: Orthopedic Surgery

## 2013-06-03 ENCOUNTER — Encounter (HOSPITAL_COMMUNITY): Payer: Self-pay

## 2013-06-03 DIAGNOSIS — M47817 Spondylosis without myelopathy or radiculopathy, lumbosacral region: Secondary | ICD-10-CM | POA: Insufficient documentation

## 2013-06-03 DIAGNOSIS — M5126 Other intervertebral disc displacement, lumbar region: Secondary | ICD-10-CM | POA: Insufficient documentation

## 2013-06-03 DIAGNOSIS — M545 Low back pain: Secondary | ICD-10-CM

## 2013-06-03 DIAGNOSIS — R937 Abnormal findings on diagnostic imaging of other parts of musculoskeletal system: Secondary | ICD-10-CM | POA: Insufficient documentation

## 2013-06-03 MED ORDER — GADOBENATE DIMEGLUMINE 529 MG/ML IV SOLN
20.0000 mL | Freq: Once | INTRAVENOUS | Status: AC
  Administered 2013-06-03: 20 mL via INTRAVENOUS

## 2014-01-14 ENCOUNTER — Encounter (HOSPITAL_COMMUNITY): Payer: Self-pay | Admitting: Pharmacy Technician

## 2014-01-19 ENCOUNTER — Other Ambulatory Visit: Payer: Self-pay | Admitting: Orthopedic Surgery

## 2014-01-21 ENCOUNTER — Encounter (HOSPITAL_COMMUNITY): Payer: Self-pay

## 2014-01-21 ENCOUNTER — Ambulatory Visit (HOSPITAL_COMMUNITY)
Admission: RE | Admit: 2014-01-21 | Discharge: 2014-01-21 | Disposition: A | Discharge status: Home / Self Care | Payer: Managed Care, Other (non HMO) | Source: Ambulatory Visit | Attending: Orthopedic Surgery | Admitting: Orthopedic Surgery

## 2014-01-21 ENCOUNTER — Encounter (HOSPITAL_COMMUNITY)
Admission: RE | Admit: 2014-01-21 | Discharge: 2014-01-21 | Disposition: A | Discharge status: Home / Self Care | Payer: Managed Care, Other (non HMO) | Source: Ambulatory Visit | Attending: Orthopedic Surgery | Admitting: Orthopedic Surgery

## 2014-01-21 DIAGNOSIS — Z01812 Encounter for preprocedural laboratory examination: Secondary | ICD-10-CM | POA: Insufficient documentation

## 2014-01-21 DIAGNOSIS — Z01818 Encounter for other preprocedural examination: Secondary | ICD-10-CM | POA: Insufficient documentation

## 2014-01-21 DIAGNOSIS — Z01810 Encounter for preprocedural cardiovascular examination: Secondary | ICD-10-CM | POA: Insufficient documentation

## 2014-01-21 HISTORY — DX: Headache: R51

## 2014-01-21 HISTORY — DX: Unspecified osteoarthritis, unspecified site: M19.90

## 2014-01-21 LAB — APTT: aPTT: 26 seconds (ref 24–37)

## 2014-01-21 LAB — BASIC METABOLIC PANEL
BUN: 19 mg/dL (ref 6–23)
CO2: 32 meq/L (ref 19–32)
Calcium: 9.9 mg/dL (ref 8.4–10.5)
Chloride: 101 mEq/L (ref 96–112)
Creatinine, Ser: 1.1 mg/dL (ref 0.50–1.35)
GFR calc non Af Amer: 79 mL/min — ABNORMAL LOW (ref >90)
GLUCOSE: 69 mg/dL — AB (ref 70–99)
POTASSIUM: 5.1 meq/L (ref 3.7–5.3)
Sodium: 142 mEq/L (ref 137–147)

## 2014-01-21 LAB — TYPE AND SCREEN
ABO/RH(D): O POS
Antibody Screen: NEGATIVE

## 2014-01-21 LAB — CBC WITH DIFFERENTIAL/PLATELET
BASOS ABS: 0 10*3/uL (ref 0.0–0.1)
BASOS PCT: 0 % (ref 0–1)
Eosinophils Absolute: 0.1 10*3/uL (ref 0.0–0.7)
Eosinophils Relative: 2 % (ref 0–5)
HEMATOCRIT: 48.1 % (ref 39.0–52.0)
Hemoglobin: 16.9 g/dL (ref 13.0–17.0)
Lymphocytes Relative: 23 % (ref 12–46)
Lymphs Abs: 1.1 10*3/uL (ref 0.7–4.0)
MCH: 31.9 pg (ref 26.0–34.0)
MCHC: 35.1 g/dL (ref 30.0–36.0)
MCV: 90.8 fL (ref 78.0–100.0)
MONOS PCT: 13 % — AB (ref 3–12)
Monocytes Absolute: 0.6 10*3/uL (ref 0.1–1.0)
NEUTROS ABS: 3 10*3/uL (ref 1.7–7.7)
Neutrophils Relative %: 63 % (ref 43–77)
Platelets: 228 10*3/uL (ref 150–400)
RBC: 5.3 MIL/uL (ref 4.22–5.81)
RDW: 15.6 % — ABNORMAL HIGH (ref 11.5–15.5)
WBC: 4.8 10*3/uL (ref 4.0–10.5)

## 2014-01-21 LAB — URINALYSIS, ROUTINE W REFLEX MICROSCOPIC
Bilirubin Urine: NEGATIVE
GLUCOSE, UA: NEGATIVE mg/dL
Hgb urine dipstick: NEGATIVE
Ketones, ur: NEGATIVE mg/dL
LEUKOCYTES UA: NEGATIVE
Nitrite: NEGATIVE
PROTEIN: NEGATIVE mg/dL
SPECIFIC GRAVITY, URINE: 1.022 (ref 1.005–1.030)
Urobilinogen, UA: 0.2 mg/dL (ref 0.0–1.0)
pH: 7 (ref 5.0–8.0)

## 2014-01-21 LAB — HEPATIC FUNCTION PANEL
ALBUMIN: 3.8 g/dL (ref 3.5–5.2)
ALT: 54 U/L — ABNORMAL HIGH (ref 0–53)
AST: 40 U/L — ABNORMAL HIGH (ref 0–37)
Alkaline Phosphatase: 69 U/L (ref 39–117)
Total Bilirubin: 0.5 mg/dL (ref 0.3–1.2)
Total Protein: 6.8 g/dL (ref 6.0–8.3)

## 2014-01-21 LAB — SURGICAL PCR SCREEN
MRSA, PCR: NEGATIVE
STAPHYLOCOCCUS AUREUS: POSITIVE — AB

## 2014-01-21 LAB — PROTIME-INR
INR: 0.94 (ref 0.00–1.49)
Prothrombin Time: 12.4 seconds (ref 11.6–15.2)

## 2014-01-21 LAB — ABO/RH: ABO/RH(D): O POS

## 2014-01-21 NOTE — Pre-Procedure Instructions (Signed)
Craig LericheMark Myers  01/21/2014   Your procedure is scheduled on:  Friday, March 6.  [facility] to [facility], Main Entrance or Entrance "A" at  8:30AM.  Call this number if you have problems the morning of surgery: [phone]   Remember:   Do not eat food or drink liquids after midnight Thursday.   Take these medicines the morning of surgery with A SIP OF WATER: Xanax if needed.   Do not wear jewelry, make-up or nail polish.  Do not wear lotions, powders, or perfumes.    Men may shave face and neck.  Do not bring valuables to the hospital.  [facility] is not responsible  for any belongings or valuables.               Contacts, dentures or bridgework may not be worn into surgery.  Leave suitcase in the car. After surgery it may be brought to your room.  For patients admitted to the hospital, discharge time is determined by your treatment team.                 Special Instructions: Review  [facility] - Preparing For Surgery for shower instructions.   Please read over the following fact sheets that you were given: Pain Booklet, Coughing and Deep Breathing, Blood Transfusion Information and Surgical Site Infection Prevention

## 2014-01-21 NOTE — Progress Notes (Signed)
Craig Craig Myers reports a concern regarding a right sided chest "discomfort", "I wouklnt really call it a pain".  Patient stated that is started about 5 months ago after moving all of his belongings from his household all by himself.Marland Kitchen. He reports that he was seen by a PA in [place] who said it probably was muscle strain.  Rates the pain "2" out of "10", denies any shortness, states it has been more noticeable in the past couple days after staying with a smoker. Craig Craig Myers is very anxious about this pain and said he would "stay here all afternoon if someone could check it out , do a MRI or something."  I explained to him that IN PAT we can not order extra test . "I want someone to let me know that my chest Xray is ok and that I don't have cancer or something."  I asked patient if he had a PCP and he said not really, I see someone to order my medications and that is all.   "I want to know if there is something wrong in there, but I don't want anything to delay my surgery."   Craig Myers asked what we are testing for in the urine, I explain it is a uranalysis to see if he might have any infection.  He denies using and recreationall drugs and reports he drinks "mininal  Alcohol."  Revonda StandardAllison notified of chestt discomfort.

## 2014-01-21 NOTE — Progress Notes (Signed)
Anesthesia [name]:  Patient is a 48 year old male scheduled for left THA on 01/29/14 by Dr. Turner Danielsowan.  History includes borderline HTN, PTSD, anxiety, seizure X 2 (in the setting of running out of Xanax; last Spring 2014 which lead to a shoulder and back injury that required surgery), post-operative PE > 15 years ago, arthritis, headaches, non-smoker, appendectomy, L4-5 diskectomy followed by surgery for spinal leak ~ 02/2013, left shoulder dislocation 02/27/13 s/p closed reduction in OR. He is in the [employer] (previously with [employer]) with several tours to [place].  He is now contracted with [employer]. No current PCP, but sees Dr. Mayford KnifeWilliams on [address] occasionally as a walk-in. Psychiatrist is Dr. Evelene CroonKaur.  Meds: Xanax 2 mg, HCTZ 25 mg, Cozaar 50 mg, ibuprofen 200 mg PRN.  He asked to be evaluated due to right chest achy soreness.  It first began after he moved his entire house belongings 5-6 months ago.  Pain is localized in his right chest, located around the nipple area, but feels "deep" within this chest. He denied pleuritic chest pain.  It has not affected his activity tolerance.  He could ride "50 miles" on a stationary bike. He is able to do push-ups on a regular basis and says pain is actually better afterwards.  Pain is worse when he moves his right arm. Chest wall is not tender to palpation. It is not like the pain he had with his prior PE, and he has no SOB at all.  The pain was evaluated in [place] initially and he was told it was musculoskeletal in nature.  It actually went away for about two months, but came back this week.  He has been living with a friend who chain smokes and he thinks it has made it worse.  Pain is 4-5/10. He is fearful that he has a lung mass.    EKG on 01/21/14 showed NSR, moderate voltage for LVH. No significant change since 02/27/13. By history, he had a prior echo in 2009 that showed mild LV thickening, EF 66%.  CXR on 01/21/14 showed no active  cardiopulmonary disease. Lung and heart sounds WNL.  Preoperative labs noted.  CXR results reviewed with patient.  No mass noted by the radiologist.  EKG is stable.  Pain is atypical for cardiac etiology, and he has excellent exercise tolerance and stable EKG. He does have a history of post-operative PE 15 years ago, but denied any SOB or pleuritic chest pain. Pain is worse with moving his RUE and initially began after moving his house belongings (working 12+ hour days for an entire week) which is more consistent with musculoskeletal etiology. If no acute changes then I anticipate that he can proceed as planned.  He was encouraged to seek a formal medical evaluation for his pain if it acutely worsens or does not improve.  Anesthesiologist Dr. Noreene LarssonJoslin also saw patient and agrees with this plan.  Velna Ochsllison Damari Suastegui, PA-C [facility]/Anesthesiology Phone [phone] 01/21/2014 5:53 PM

## 2014-01-28 MED ORDER — CEFAZOLIN SODIUM-DEXTROSE 2-3 GM-% IV SOLR
2.0000 g | INTRAVENOUS | Status: AC
  Administered 2014-01-29: 2 g via INTRAVENOUS

## 2014-01-28 NOTE — Progress Notes (Signed)
Called and informed of time change.  Pt to be here at 1030 tomorrow am.  Voices understanding.

## 2014-01-28 NOTE — H&P (Signed)
TOTAL HIP ADMISSION H&P  Patient is admitted for left total hip arthroplasty.  Subjective:  Chief Complaint: left hip pain  HPI: Craig Myers, 48 y.o. male, has a history of pain and functional disability in the left hip(s) due to arthritis and patient has failed non-surgical conservative treatments for greater than 12 weeks to include NSAID's and/or analgesics, corticosteriod injections, flexibility and strengthening excercises and activity modification.  Onset of symptoms was gradual starting >10 years ago with gradually worsening course since that time.The patient noted no past surgery on the left hip(s).  Patient currently rates pain in the left hip at 10 out of 10 with activity. Patient has worsening of pain with activity and weight bearing, pain that interfers with activities of daily living, pain with passive range of motion and crepitus. Patient has evidence of joint space narrowing by imaging studies. This condition presents safety issues increasing the risk of falls. There is no current active infection.  There are no active problems to display for this patient.  Past Medical History  Diagnosis Date  . Hypertension     hx/o borderline, controlled currently 6/12  . PTSD (post-traumatic stress disorder)   . Anxiety   . Pulmonary embolism   . Epileptic     seizure 2 years ago - takes Xanax .  One time seizure was do to running out of Xanax  . Arthritis   . Headache(784.0)     after back surgery    Past Surgical History  Procedure Laterality Date  . Appendectomy    . Hip surgery Left 1999    arthroscopy  . Doppler echocardiography  08/2008    mild LV thickening, EF 66%  . Shoulder closed reduction Left 02/27/2013    Procedure: CLOSED REDUCTION SHOULDER;  Surgeon: Toni ArthursJohn Hewitt, MD;  [facility];  Service: Orthopedics;  Laterality: Left;  . Back surgery      L4- L5   disectomy and 2nd surgery for spinal fluid leakage    No prescriptions prior to admission   Allergies   Allergen Reactions  . Codeine     Unknown; takes percomt currently with no problem  . Tylenol [Acetaminophen] Other (See Comments)    Very slight nausea     History  Substance Use Topics  . Smoking status: Never Smoker   . Smokeless tobacco: Never Used  . Alcohol Use: 1.0 oz/week    2 drink(s) per week     Comment: minimal    No family history on file.   Review of Systems  Constitutional: Negative.   HENT: Negative.   Eyes: Negative.   Respiratory: Negative.   Cardiovascular: Negative.   Gastrointestinal: Negative.   Genitourinary: Negative.   Musculoskeletal: Positive for joint pain.  Skin: Negative.   Neurological: Negative.   Psychiatric/Behavioral: Negative.     Objective:  Physical Exam  Constitutional: He is oriented to person, place, and time. He appears well-developed and well-nourished.  HENT:  Head: Normocephalic and atraumatic.  Eyes: Pupils are equal, round, and reactive to light.  Neck: Normal range of motion. Neck supple.  Cardiovascular: Intact distal pulses.   Respiratory: Effort normal.  Musculoskeletal:  The patient has very little external rotation or internal rotation to the left hip internal rotation to -5 causes severe pain foot tap is negative, he has a 10 flexion contracture.  The contralateral right hip internal and external rotates 20 each with little if any discomfort.  Neurological: He is alert and oriented to person, place, and time.  Skin: Skin is warm and dry.  Psychiatric: He has a normal mood and affect. His behavior is normal. Judgment and thought content normal.    Vital signs in last 24 hours:    Labs:   Estimated body mass index is 25.98 kg/(m^2) as calculated from the following:   Height as of 02/27/13: 6\' 3"  (1.905 m).   Weight as of 03/08/13: 94.3 kg (207 lb 14.3 oz).   Imaging Review Radiographs:  X-rays were ordered, performed, and interpreted by me today included; AP pelvis shows progression of the bone on bone  arthritis now with subchondral cyst of the femoral head and partial collapse on the left side the right side still has a decent cartilage layer.  Assessment/Plan:  End stage arthritis, left hip(s)  The patient history, physical examination, clinical judgement of the provider and imaging studies are consistent with end stage degenerative joint disease of the left hip(s) and total hip arthroplasty is deemed medically necessary. The treatment options including medical management, injection therapy, arthroscopy and arthroplasty were discussed at length. The risks and benefits of total hip arthroplasty were presented and reviewed. The risks due to aseptic loosening, infection, stiffness, dislocation/subluxation,  thromboembolic complications and other imponderables were discussed.  The patient acknowledged the explanation, agreed to proceed with the plan and consent was signed. Patient is being admitted for inpatient treatment for surgery, pain control, PT, OT, prophylactic antibiotics, VTE prophylaxis, progressive ambulation and ADL's and discharge planning.The patient is planning to be discharged home with home health services

## 2014-01-29 ENCOUNTER — Encounter (HOSPITAL_COMMUNITY)
Admission: RE | Disposition: A | Discharge status: Home Health | Payer: Self-pay | Source: Ambulatory Visit | Attending: Orthopedic Surgery

## 2014-01-29 ENCOUNTER — Inpatient Hospital Stay (HOSPITAL_COMMUNITY): Payer: Managed Care, Other (non HMO)

## 2014-01-29 ENCOUNTER — Encounter (HOSPITAL_COMMUNITY): Payer: Self-pay | Admitting: *Deleted

## 2014-01-29 ENCOUNTER — Encounter (HOSPITAL_COMMUNITY): Payer: Managed Care, Other (non HMO) | Admitting: Vascular Surgery

## 2014-01-29 ENCOUNTER — Inpatient Hospital Stay (HOSPITAL_COMMUNITY)
Admission: RE | Admit: 2014-01-29 | Discharge: 2014-01-31 | DRG: 470 | Disposition: A | Discharge status: Home Health | Payer: Managed Care, Other (non HMO) | Source: Ambulatory Visit | Attending: Orthopedic Surgery | Admitting: Orthopedic Surgery

## 2014-01-29 ENCOUNTER — Inpatient Hospital Stay (HOSPITAL_COMMUNITY): Payer: Managed Care, Other (non HMO) | Admitting: Anesthesiology

## 2014-01-29 DIAGNOSIS — M1612 Unilateral primary osteoarthritis, left hip: Secondary | ICD-10-CM

## 2014-01-29 DIAGNOSIS — F4310 Post-traumatic stress disorder, unspecified: Secondary | ICD-10-CM | POA: Diagnosis present

## 2014-01-29 DIAGNOSIS — Z86711 Personal history of pulmonary embolism: Secondary | ICD-10-CM

## 2014-01-29 DIAGNOSIS — Z888 Allergy status to other drugs, medicaments and biological substances status: Secondary | ICD-10-CM

## 2014-01-29 DIAGNOSIS — M169 Osteoarthritis of hip, unspecified: Principal | ICD-10-CM | POA: Diagnosis present

## 2014-01-29 DIAGNOSIS — F411 Generalized anxiety disorder: Secondary | ICD-10-CM | POA: Diagnosis present

## 2014-01-29 DIAGNOSIS — G40909 Epilepsy, unspecified, not intractable, without status epilepticus: Secondary | ICD-10-CM | POA: Diagnosis present

## 2014-01-29 DIAGNOSIS — Z9089 Acquired absence of other organs: Secondary | ICD-10-CM

## 2014-01-29 DIAGNOSIS — I10 Essential (primary) hypertension: Secondary | ICD-10-CM | POA: Diagnosis present

## 2014-01-29 DIAGNOSIS — M1610 Unilateral primary osteoarthritis, unspecified hip: Principal | ICD-10-CM | POA: Diagnosis present

## 2014-01-29 DIAGNOSIS — Z886 Allergy status to analgesic agent status: Secondary | ICD-10-CM

## 2014-01-29 HISTORY — PX: TOTAL HIP ARTHROPLASTY: SHX124

## 2014-01-29 SURGERY — ARTHROPLASTY, HIP, TOTAL,POSTERIOR APPROACH
Anesthesia: General | Site: Hip | Laterality: Left

## 2014-01-29 MED ORDER — LOSARTAN POTASSIUM 50 MG PO TABS
50.0000 mg | ORAL_TABLET | Freq: Every day | ORAL | Status: DC
  Administered 2014-01-29 – 2014-01-31 (×3): 50 mg via ORAL
  Filled 2014-01-29 (×3): qty 1

## 2014-01-29 MED ORDER — BUPIVACAINE-EPINEPHRINE (PF) 0.5% -1:200000 IJ SOLN
INTRAMUSCULAR | Status: AC
  Filled 2014-01-29: qty 10

## 2014-01-29 MED ORDER — ROCURONIUM BROMIDE 100 MG/10ML IV SOLN
INTRAVENOUS | Status: DC | PRN
  Administered 2014-01-29: 20 mg via INTRAVENOUS
  Administered 2014-01-29: 70 mg via INTRAVENOUS
  Administered 2014-01-29: 10 mg via INTRAVENOUS

## 2014-01-29 MED ORDER — METHOCARBAMOL 500 MG PO TABS
ORAL_TABLET | ORAL | Status: AC
  Filled 2014-01-29: qty 1

## 2014-01-29 MED ORDER — ALPRAZOLAM 0.5 MG PO TABS
1.0000 mg | ORAL_TABLET | Freq: Every day | ORAL | Status: DC
  Administered 2014-01-29 – 2014-01-31 (×3): 1 mg via ORAL
  Filled 2014-01-29 (×3): qty 2

## 2014-01-29 MED ORDER — MEPERIDINE HCL 25 MG/ML IJ SOLN: 6.2500 mg | INTRAMUSCULAR | Status: DC | PRN

## 2014-01-29 MED ORDER — ONDANSETRON HCL 4 MG/2ML IJ SOLN
INTRAMUSCULAR | Status: AC
  Filled 2014-01-29: qty 6

## 2014-01-29 MED ORDER — OXYCODONE-ACETAMINOPHEN 5-325 MG PO TABS: 1.0000 | ORAL_TABLET | ORAL | Status: AC | PRN

## 2014-01-29 MED ORDER — FENTANYL CITRATE 0.05 MG/ML IJ SOLN
INTRAMUSCULAR | Status: AC
  Filled 2014-01-29: qty 5

## 2014-01-29 MED ORDER — METHOCARBAMOL 500 MG PO TABS: 500.0000 mg | ORAL_TABLET | Freq: Two times a day (BID) | ORAL | Status: AC

## 2014-01-29 MED ORDER — GLYCOPYRROLATE 0.2 MG/ML IJ SOLN
INTRAMUSCULAR | Status: AC
  Filled 2014-01-29: qty 2

## 2014-01-29 MED ORDER — MIDAZOLAM HCL 5 MG/5ML IJ SOLN
INTRAMUSCULAR | Status: DC | PRN
  Administered 2014-01-29: 2 mg via INTRAVENOUS

## 2014-01-29 MED ORDER — ONDANSETRON HCL 4 MG/2ML IJ SOLN
INTRAMUSCULAR | Status: DC | PRN
  Administered 2014-01-29: 4 mg via INTRAVENOUS

## 2014-01-29 MED ORDER — OXYCODONE HCL 5 MG PO TABS
5.0000 mg | ORAL_TABLET | ORAL | Status: DC | PRN
  Administered 2014-01-29 – 2014-01-30 (×6): 10 mg via ORAL
  Administered 2014-01-31: 5 mg via ORAL
  Administered 2014-01-31: 10 mg via ORAL
  Filled 2014-01-29 (×8): qty 2

## 2014-01-29 MED ORDER — BISACODYL 5 MG PO TBEC: 5.0000 mg | DELAYED_RELEASE_TABLET | Freq: Every day | ORAL | Status: DC | PRN

## 2014-01-29 MED ORDER — GLYCOPYRROLATE 0.2 MG/ML IJ SOLN
INTRAMUSCULAR | Status: DC | PRN
  Administered 2014-01-29 (×2): 0.2 mg via INTRAVENOUS
  Administered 2014-01-29: .4 mg via INTRAVENOUS

## 2014-01-29 MED ORDER — CEFAZOLIN SODIUM-DEXTROSE 2-3 GM-% IV SOLR
INTRAVENOUS | Status: AC
  Filled 2014-01-29: qty 50

## 2014-01-29 MED ORDER — MAGNESIUM CITRATE PO SOLN: 1.0000 | Freq: Once | ORAL | Status: AC | PRN

## 2014-01-29 MED ORDER — METHOCARBAMOL 500 MG PO TABS
500.0000 mg | ORAL_TABLET | Freq: Four times a day (QID) | ORAL | Status: DC | PRN
  Administered 2014-01-29 – 2014-01-31 (×7): 500 mg via ORAL
  Filled 2014-01-29 (×7): qty 1

## 2014-01-29 MED ORDER — METOCLOPRAMIDE HCL 5 MG PO TABS
5.0000 mg | ORAL_TABLET | Freq: Three times a day (TID) | ORAL | Status: DC | PRN
  Filled 2014-01-29: qty 2

## 2014-01-29 MED ORDER — SODIUM CHLORIDE 0.9 % IR SOLN
Status: DC | PRN
  Administered 2014-01-29: 1000 mL

## 2014-01-29 MED ORDER — SENNOSIDES-DOCUSATE SODIUM 8.6-50 MG PO TABS: 1.0000 | ORAL_TABLET | Freq: Every evening | ORAL | Status: DC | PRN

## 2014-01-29 MED ORDER — ONDANSETRON HCL 4 MG PO TABS: 4.0000 mg | ORAL_TABLET | Freq: Four times a day (QID) | ORAL | Status: DC | PRN

## 2014-01-29 MED ORDER — ONDANSETRON HCL 4 MG/2ML IJ SOLN: 4.0000 mg | Freq: Four times a day (QID) | INTRAMUSCULAR | Status: DC | PRN

## 2014-01-29 MED ORDER — OXYCODONE HCL 5 MG PO TABS
5.0000 mg | ORAL_TABLET | Freq: Once | ORAL | Status: AC | PRN
  Administered 2014-01-29: 5 mg via ORAL

## 2014-01-29 MED ORDER — GLYCOPYRROLATE 0.2 MG/ML IJ SOLN
INTRAMUSCULAR | Status: AC
  Filled 2014-01-29: qty 5

## 2014-01-29 MED ORDER — LIDOCAINE HCL (CARDIAC) 20 MG/ML IV SOLN
INTRAVENOUS | Status: DC | PRN
Start: 2014-01-29 — End: 2014-01-29
  Administered 2014-01-29: 100 mg via INTRAVENOUS

## 2014-01-29 MED ORDER — DOCUSATE SODIUM 100 MG PO CAPS
100.0000 mg | ORAL_CAPSULE | Freq: Two times a day (BID) | ORAL | Status: DC
  Administered 2014-01-29 – 2014-01-31 (×4): 100 mg via ORAL
  Filled 2014-01-29 (×6): qty 1

## 2014-01-29 MED ORDER — ACETAMINOPHEN 325 MG PO TABS: 650.0000 mg | ORAL_TABLET | Freq: Four times a day (QID) | ORAL | Status: DC | PRN

## 2014-01-29 MED ORDER — HYDROCHLOROTHIAZIDE 25 MG PO TABS
25.0000 mg | ORAL_TABLET | Freq: Every day | ORAL | Status: DC
  Administered 2014-01-29 – 2014-01-31 (×3): 25 mg via ORAL
  Filled 2014-01-29 (×3): qty 1

## 2014-01-29 MED ORDER — TRANEXAMIC ACID 100 MG/ML IV SOLN
1000.0000 mg | INTRAVENOUS | Status: AC
  Administered 2014-01-29: 1000 mg via INTRAVENOUS
  Filled 2014-01-29: qty 10

## 2014-01-29 MED ORDER — NEOSTIGMINE METHYLSULFATE 1 MG/ML IJ SOLN
INTRAMUSCULAR | Status: DC | PRN
  Administered 2014-01-29: 3 mg via INTRAVENOUS

## 2014-01-29 MED ORDER — MENTHOL 3 MG MT LOZG
1.0000 | LOZENGE | OROMUCOSAL | Status: DC | PRN
  Filled 2014-01-29 (×2): qty 9

## 2014-01-29 MED ORDER — DEXTROSE-NACL 5-0.45 % IV SOLN
INTRAVENOUS | Status: DC
Start: 2014-01-29 — End: 2014-01-29

## 2014-01-29 MED ORDER — LACTATED RINGERS IV SOLN
INTRAVENOUS | Status: DC
  Administered 2014-01-29 (×2): via INTRAVENOUS

## 2014-01-29 MED ORDER — MIDAZOLAM HCL 2 MG/2ML IJ SOLN
INTRAMUSCULAR | Status: AC
  Filled 2014-01-29: qty 2

## 2014-01-29 MED ORDER — NEOSTIGMINE METHYLSULFATE 1 MG/ML IJ SOLN
INTRAMUSCULAR | Status: AC
  Filled 2014-01-29: qty 10

## 2014-01-29 MED ORDER — KCL IN DEXTROSE-NACL 20-5-0.45 MEQ/L-%-% IV SOLN
INTRAVENOUS | Status: DC
  Administered 2014-01-29 – 2014-01-30 (×2): via INTRAVENOUS
  Filled 2014-01-29 (×9): qty 1000

## 2014-01-29 MED ORDER — OXYCODONE HCL 5 MG/5ML PO SOLN: 5.0000 mg | Freq: Once | ORAL | Status: AC | PRN

## 2014-01-29 MED ORDER — ASPIRIN EC 325 MG PO TBEC
325.0000 mg | DELAYED_RELEASE_TABLET | Freq: Every day | ORAL | Status: DC
  Administered 2014-01-30 – 2014-01-31 (×2): 325 mg via ORAL
  Filled 2014-01-29 (×3): qty 1

## 2014-01-29 MED ORDER — OXYCODONE HCL 5 MG PO TABS
ORAL_TABLET | ORAL | Status: AC
  Filled 2014-01-29: qty 1

## 2014-01-29 MED ORDER — HYDROMORPHONE HCL PF 1 MG/ML IJ SOLN
0.2500 mg | INTRAMUSCULAR | Status: DC | PRN
  Administered 2014-01-29 (×4): 0.5 mg via INTRAVENOUS

## 2014-01-29 MED ORDER — METOCLOPRAMIDE HCL 5 MG/ML IJ SOLN
5.0000 mg | Freq: Three times a day (TID) | INTRAMUSCULAR | Status: DC | PRN
  Filled 2014-01-29: qty 2

## 2014-01-29 MED ORDER — METHOCARBAMOL 100 MG/ML IJ SOLN
500.0000 mg | Freq: Four times a day (QID) | INTRAVENOUS | Status: DC | PRN
  Filled 2014-01-29: qty 5

## 2014-01-29 MED ORDER — FENTANYL CITRATE 0.05 MG/ML IJ SOLN
INTRAMUSCULAR | Status: DC | PRN
  Administered 2014-01-29: 50 ug via INTRAVENOUS
  Administered 2014-01-29: 150 ug via INTRAVENOUS
  Administered 2014-01-29: 100 ug via INTRAVENOUS
  Administered 2014-01-29: 25 ug via INTRAVENOUS
  Administered 2014-01-29: 100 ug via INTRAVENOUS
  Administered 2014-01-29: 150 ug via INTRAVENOUS
  Administered 2014-01-29: 25 ug via INTRAVENOUS

## 2014-01-29 MED ORDER — DIPHENHYDRAMINE HCL 12.5 MG/5ML PO ELIX: 12.5000 mg | ORAL_SOLUTION | ORAL | Status: DC | PRN

## 2014-01-29 MED ORDER — ONDANSETRON HCL 4 MG/2ML IJ SOLN: 4.0000 mg | Freq: Once | INTRAMUSCULAR | Status: DC | PRN

## 2014-01-29 MED ORDER — MUPIROCIN 2 % EX OINT
TOPICAL_OINTMENT | Freq: Two times a day (BID) | CUTANEOUS | Status: DC
  Administered 2014-01-29: 11:00:00 via NASAL
  Filled 2014-01-29 (×2): qty 22

## 2014-01-29 MED ORDER — HYDROMORPHONE HCL PF 1 MG/ML IJ SOLN
INTRAMUSCULAR | Status: AC
  Filled 2014-01-29: qty 1

## 2014-01-29 MED ORDER — HYDROMORPHONE HCL PF 1 MG/ML IJ SOLN
1.0000 mg | INTRAMUSCULAR | Status: DC | PRN
  Administered 2014-01-29 – 2014-01-30 (×3): 1 mg via INTRAVENOUS
  Filled 2014-01-29 (×3): qty 1

## 2014-01-29 MED ORDER — GLYCOPYRROLATE 0.2 MG/ML IJ SOLN
INTRAMUSCULAR | Status: AC
  Filled 2014-01-29: qty 1

## 2014-01-29 MED ORDER — BUPIVACAINE-EPINEPHRINE 0.5% -1:200000 IJ SOLN
INTRAMUSCULAR | Status: DC | PRN
  Administered 2014-01-29: 20 mL

## 2014-01-29 MED ORDER — ALPRAZOLAM 0.5 MG PO TABS
1.5000 mg | ORAL_TABLET | Freq: Every day | ORAL | Status: DC
  Administered 2014-01-30 – 2014-01-31 (×2): 1.5 mg via ORAL
  Filled 2014-01-29 (×3): qty 3

## 2014-01-29 MED ORDER — ACETAMINOPHEN 650 MG RE SUPP: 650.0000 mg | Freq: Four times a day (QID) | RECTAL | Status: DC | PRN

## 2014-01-29 MED ORDER — PROPOFOL 10 MG/ML IV BOLUS
INTRAVENOUS | Status: DC | PRN
  Administered 2014-01-29: 200 mg via INTRAVENOUS

## 2014-01-29 MED ORDER — PHENOL 1.4 % MT LIQD: 1.0000 | OROMUCOSAL | Status: DC | PRN

## 2014-01-29 MED ORDER — ASPIRIN EC 325 MG PO TBEC: 325.0000 mg | DELAYED_RELEASE_TABLET | Freq: Two times a day (BID) | ORAL | Status: AC

## 2014-01-29 SURGICAL SUPPLY — 51 items
BLADE SAW SGTL 18X1.27X75 (BLADE) ×2 IMPLANT
BLADE SAW SGTL 18X1.27X75MM (BLADE) ×1
BRUSH FEMORAL CANAL (MISCELLANEOUS) IMPLANT
CAPT HIP PF COP ×3 IMPLANT
COVER BACK TABLE 24X17X13 BIG (DRAPES) IMPLANT
COVER SURGICAL LIGHT HANDLE (MISCELLANEOUS) ×3 IMPLANT
DRAPE ORTHO SPLIT 77X108 STRL (DRAPES) ×2
DRAPE PROXIMA HALF (DRAPES) ×3 IMPLANT
DRAPE SURG ORHT 6 SPLT 77X108 (DRAPES) ×1 IMPLANT
DRAPE U-SHAPE 47X51 STRL (DRAPES) ×3 IMPLANT
DRILL BIT 7/64X5 (BIT) ×3 IMPLANT
DRSG AQUACEL AG ADV 3.5X10 (GAUZE/BANDAGES/DRESSINGS) ×3 IMPLANT
DURAPREP 26ML APPLICATOR (WOUND CARE) ×3 IMPLANT
ELECT BLADE 4.0 EZ CLEAN MEGAD (MISCELLANEOUS) ×3
ELECT REM PT RETURN 9FT ADLT (ELECTROSURGICAL) ×3
ELECTRODE BLDE 4.0 EZ CLN MEGD (MISCELLANEOUS) ×1 IMPLANT
ELECTRODE REM PT RTRN 9FT ADLT (ELECTROSURGICAL) ×1 IMPLANT
GAUZE XEROFORM 1X8 LF (GAUZE/BANDAGES/DRESSINGS) ×3 IMPLANT
GLOVE BIO SURGEON STRL SZ7.5 (GLOVE) ×3 IMPLANT
GLOVE BIO SURGEON STRL SZ8.5 (GLOVE) ×6 IMPLANT
GLOVE BIOGEL PI IND STRL 8 (GLOVE) ×2 IMPLANT
GLOVE BIOGEL PI IND STRL 9 (GLOVE) ×1 IMPLANT
GLOVE BIOGEL PI INDICATOR 8 (GLOVE) ×4
GLOVE BIOGEL PI INDICATOR 9 (GLOVE) ×2
GOWN PREVENTION PLUS XLARGE (GOWN DISPOSABLE) ×6 IMPLANT
GOWN STRL NON-REIN LRG LVL3 (GOWN DISPOSABLE) ×6 IMPLANT
GOWN STRL REIN XL XLG (GOWN DISPOSABLE) ×6 IMPLANT
HANDPIECE INTERPULSE COAX TIP (DISPOSABLE)
HOOD PEEL AWAY FACE SHEILD DIS (HOOD) ×9 IMPLANT
KIT BASIN OR (CUSTOM PROCEDURE TRAY) ×3 IMPLANT
KIT ROOM TURNOVER OR (KITS) ×3 IMPLANT
MANIFOLD NEPTUNE II (INSTRUMENTS) ×3 IMPLANT
NEEDLE 22X1 1/2 (OR ONLY) (NEEDLE) ×3 IMPLANT
NS IRRIG 1000ML POUR BTL (IV SOLUTION) ×3 IMPLANT
PACK TOTAL JOINT (CUSTOM PROCEDURE TRAY) ×3 IMPLANT
PAD ARMBOARD 7.5X6 YLW CONV (MISCELLANEOUS) ×6 IMPLANT
PASSER SUT SWANSON 36MM LOOP (INSTRUMENTS) ×3 IMPLANT
PRESSURIZER FEMORAL UNIV (MISCELLANEOUS) IMPLANT
SET HNDPC FAN SPRY TIP SCT (DISPOSABLE) IMPLANT
SUT ETHIBOND 2 V 37 (SUTURE) ×3 IMPLANT
SUT ETHILON 3 0 FSL (SUTURE) ×3 IMPLANT
SUT VIC AB 0 CTB1 27 (SUTURE) ×3 IMPLANT
SUT VIC AB 1 CTX 36 (SUTURE) ×2
SUT VIC AB 1 CTX36XBRD ANBCTR (SUTURE) ×1 IMPLANT
SUT VIC AB 2-0 CTB1 (SUTURE) ×3 IMPLANT
SYR CONTROL 10ML LL (SYRINGE) ×3 IMPLANT
TOWEL OR 17X24 6PK STRL BLUE (TOWEL DISPOSABLE) ×3 IMPLANT
TOWEL OR 17X26 10 PK STRL BLUE (TOWEL DISPOSABLE) ×3 IMPLANT
TOWER CARTRIDGE SMART MIX (DISPOSABLE) IMPLANT
TRAY FOLEY CATH 14FR (SET/KITS/TRAYS/PACK) IMPLANT
WATER STERILE IRR 1000ML POUR (IV SOLUTION) ×12 IMPLANT

## 2014-01-29 NOTE — Preoperative (Signed)
Beta Blockers   Reason not to administer Beta Blockers:Not Applicable 

## 2014-01-29 NOTE — Op Note (Signed)
OPERATIVE REPORT    DATE OF PROCEDURE:  01/29/2014       PREOPERATIVE DIAGNOSIS:  DEGENERATIVE JOINT DISEASE                                                          POSTOPERATIVE DIAGNOSIS:  DJD                                                           PROCEDURE:  L total hip arthroplasty using a 58 mm DePuy Pinnacle  Cup, Peabody Energypex Hole Eliminator, 10-degree polyethylene liner index superior  and posterior, a +0 36 mm ceramic head, a 18x13x160x42 SROM stem, 18FL Sleeve   SURGEON: Kenshawn Maciolek J    ASSISTANT:   Eric K. [employer]iant EnergyPhillips PA-C  (present throughout entire procedure and necessary for timely completion of the procedure)   ANESTHESIA: General BLOOD LOSS: 400 FLUID REPLACEMENT: 1800 crystalloid DRAINS: Foley Catheter URINE OUTPUT: 300cc COMPLICATIONS: none    INDICATIONS FOR PROCEDURE: A 48 y.o. year-old With  DEGENERATIVE JOINT DISEASE   for 3 years, x-rays show bone-on-bone arthritic changes. Despite conservative measures with observation, anti-inflammatory medicine, narcotics, use of a cane, has severe unremitting pain and can ambulate only a few blocks before resting.  Patient desires elective L total hip arthroplasty to decrease pain and increase function. The risks, benefits, and alternatives were discussed at length including but not limited to the risks of infection, bleeding, nerve injury, stiffness, blood clots, the need for revision surgery, cardiopulmonary complications, among others, and they were willing to proceed. Questions answered     PROCEDURE IN DETAIL: The patient was identified by armband,  [facility], taken to the operating room , appropriate anesthetic monitors  were attached and general endotracheal anesthesia induced. Foley catheter was inserted. Pt was rolled into the R lateral decubitus position and fixed there with a Stulberg Arden II pelvic clamp.  The L lower extremity was then prepped and  draped  in the usual sterile fashion from the ankle to the hemipelvis. A time-out  procedure was performed. The skin along the lateral hip and thigh  infiltrated with 10 mL of 0.5% Marcaine and epinephrine solution. We  then made a posterolateral approach to the hip. With a #10 blade, a 12 cm  incision was made through the skin and subcutaneous tissue down to the level of the  IT band. Small bleeders were identified and cauterized. The IT band was cut in  line with skin incision exposing the greater trochanter. A Cobra retractor was placed between the gluteus minimus and the superior hip joint capsule, and a spiked Cobra between the quadratus femoris and the inferior hip joint capsule. This isolated the short  external rotators and piriformis tendons. These were tagged with a #2 Ethibond  suture and cut off their insertion on the intertrochanteric crest. The posterior  capsule was then developed into an acetabular-based flap from Posterior Superior off of the acetabulum out over the femoral neck and back posterior inferior to the acetabular rim. This flap was tagged with two #2 Ethibond sutures and  retracted protecting the sciatic nerve. This exposed the arthritic femoral head and osteophytes. The hip was then flexed and internally rotated, dislocating the femoral head and a standard neck cut performed 1 fingerbreadth above the lesser trochanter.  A spiked Cobra was placed in the cotyloid notch and a Hohmann retractor was then used to lever the femur anteriorly off of the anterior pelvic column. A posterior-inferior wing retractor was placed at the junction of the acetabulum and the ischium completing the acetabular exposure.We then removed the peripheral osteophytes and labrum from the acetabulum. We then reamed the acetabulum up to 57 mm with basket reamers obtaining good coverage in all quadrants. We then irrigated with normal  saline solution and hammered into place a 58 mm pinnacle cup in 45   degrees of abduction and about 20 degrees of anteversion. More  peripheral osteophytes removed and a trial 10-degree liner placed with the  index superior-posterior. The hip was then flexed and internally rotated exposing the  proximal femur, which was entered with the initiating reamer followed by  the axial reamers up to a 13.5 mm full depth and 14mm partial depth. We then conically reamed to 63f to the correct depth for a 42 base neck. The calcar was milled to 18FL. A trial cone and stem was inserted in the 25 degrees anteversion, with a +0 36mm trial head. Trial reduction was then performed and excellent stability was noted with at 90 of flexion with 75 of internal rotation and then full extension with maximal external rotation. The hip could not be dislocated in full extension. The knee could easily flex  to about 130 degrees. We also stretched the abductors at this point,  because of the preexisting adductor contractures. All trial components  were then removed. The acetabulum was irrigated out with normal saline  solution. A titanium [facility] was then screwed into place  followed by a 10-degree polyethylene liner index superior-posterior. On  the femoral side a 18FL ZTT1 sleeve was hammered into place, followed by a 18x13x42x160 SROM stem in 25 degrees of anteversion. At this point, a +0 36 mm ceramic head was  hammered on the stem. The hip was reduced. We checked our stability  one more time and found it to be excellent. The wound was once again  thoroughly irrigated out with normal saline solution pulse lavage. The  capsular flap and short external rotators were repaired back to the  intertrochanteric crest through drill holes with a #2 Ethibond suture.  The IT band was closed with running 1 Vicryl suture. The subcutaneous  tissue with 0 and 2-0 undyed Vicryl suture and the skin with running  interlocking 3-0 nylon suture. Dressing of Xeroform and Mepilex was  then applied.  The patient was then unclamped, rolled supine, awaken extubated and taken to recovery room without difficulty in stable condition.   Gean Birchwood J 01/29/2014, 2:47 PM

## 2014-01-29 NOTE — Interval H&P Note (Signed)
History and Physical Interval Note:  01/29/2014 10:26 AM  Craig Myers  has presented today for surgery, with the diagnosis of DEGENERATIVE JOINT DISEASE  The various methods of treatment have been discussed with the patient and family. After consideration of risks, benefits and other options for treatment, the patient has consented to  Procedure(s): TOTAL HIP ARTHROPLASTY (Left) as a surgical intervention .  The patient's history has been reviewed, patient examined, no change in status, stable for surgery.  I have reviewed the patient's chart and labs.  Questions were answered to the patient's satisfaction.     Nestor LewandowskyOWAN,[name]

## 2014-01-29 NOTE — Anesthesia Preprocedure Evaluation (Signed)
Anesthesia Evaluation  Patient identified by MRN, date of birth, ID band Patient awake    Reviewed: Allergy & Precautions, H&P , NPO status , Patient's Chart, lab work & pertinent test results  Airway Mallampati: I TM Distance: >3 FB Neck ROM: Full    Dental   Pulmonary          Cardiovascular hypertension, Pt. on medications     Neuro/Psych Anxiety    GI/Hepatic   Endo/Other    Renal/GU      Musculoskeletal   Abdominal   Peds  Hematology   Anesthesia Other Findings   Reproductive/Obstetrics                           Anesthesia Physical Anesthesia Plan  ASA: II  Anesthesia Plan: General   Post-op Pain Management:    Induction: Intravenous  Airway Management Planned: Oral ETT  Additional Equipment:   Intra-op Plan:   Post-operative Plan: Extubation in OR  Informed Consent: I have reviewed the patients History and Physical, chart, labs and discussed the procedure including the risks, benefits and alternatives for the proposed anesthesia with the patient or authorized representative who has indicated his/her understanding and acceptance.     Plan Discussed with: CRNA and Surgeon  Anesthesia Plan Comments:         Anesthesia Quick Evaluation  

## 2014-01-29 NOTE — Transfer of Care (Signed)
Immediate Anesthesia Transfer of Care Note  Patient: Craig Myers  Procedure(s) Performed: Procedure(s): TOTAL HIP ARTHROPLASTY (Left)  Patient Location: PACU  Anesthesia Type:General  Level of Consciousness: awake, alert  and oriented  Airway & Oxygen Therapy: Patient Spontanous Breathing and Patient connected to nasal cannula oxygen  Post-op Assessment: Report given to PACU RN, Post -op Vital signs reviewed and stable and Patient moving all extremities X 4  Post vital signs: Reviewed and stable  Complications: No apparent anesthesia complications

## 2014-01-30 LAB — BASIC METABOLIC PANEL
BUN: 13 mg/dL (ref 6–23)
CO2: 29 meq/L (ref 19–32)
CREATININE: 1.07 mg/dL (ref 0.50–1.35)
Calcium: 8.3 mg/dL — ABNORMAL LOW (ref 8.4–10.5)
Chloride: 93 mEq/L — ABNORMAL LOW (ref 96–112)
GFR calc Af Amer: >90 mL/min (ref >90)
GFR, EST NON AFRICAN AMERICAN: 82 mL/min — AB (ref >90)
Glucose, Bld: 130 mg/dL — ABNORMAL HIGH (ref 70–99)
Potassium: 4 mEq/L (ref 3.7–5.3)
Sodium: 131 mEq/L — ABNORMAL LOW (ref 137–147)

## 2014-01-30 LAB — CBC
HCT: 38.3 % — ABNORMAL LOW (ref 39.0–52.0)
Hemoglobin: 13.4 g/dL (ref 13.0–17.0)
MCH: 31.9 pg (ref 26.0–34.0)
MCHC: 35 g/dL (ref 30.0–36.0)
MCV: 91.2 fL (ref 78.0–100.0)
PLATELETS: 192 10*3/uL (ref 150–400)
RBC: 4.2 MIL/uL — AB (ref 4.22–5.81)
RDW: 15.9 % — AB (ref 11.5–15.5)
WBC: 8.3 10*3/uL (ref 4.0–10.5)

## 2014-01-30 NOTE — Progress Notes (Signed)
Pt has been unable to void in urinal. Pt has been stood up with walker and 2 assist to attempt at urinating. Pt has tried multiple times and began to feel distended and tight. Bladder scan was done, 999cc was found. In and out cath was done which resulted in 1100 ccs. Pt handled in and out well. Will continue to monitor.

## 2014-01-30 NOTE — Progress Notes (Signed)
PT Cancellation Note  Patient Details Name: Craig KaysMark Myers MRN: [id number] DOB: June 22, 1966   Cancelled Treatment:    Reason Eval/Treat Not Completed: Fatigue/lethargy limiting ability to participate  Pt declining Rx this PM due to fatigue.  Exercise handouts left in room to begin tomorrow.   Ilda FoilGarrow, Dahlton Hinde Rene 01/30/2014, 2:22 PM  Aida RaiderWendy Penn Grissett, PT  Office # [phone] Pager [phone]

## 2014-01-30 NOTE — Progress Notes (Signed)
Patient ID: Craig KaysMark Zemaitis, male   DOB: 09-02-1966, 48 y.o.   MRN: [id number] PATIENT ID: Craig Myers  MRN: [id number]  DOB/AGE:  46-06-1966 / 48 y.o.  1 Day Post-Op Procedure(s) (LRB): TOTAL HIP ARTHROPLASTY (Left)    PROGRESS NOTE Subjective: Patient is alert, oriented,no Nausea, no Vomiting, yes passing gas, no Bowel Movement. Taking PO well. Denies SOB, Chest or Calf Pain. Using Incentive Spirometer, PAS in place. Ambulate WBAT, patient required in and out cath last night 1100 cc but he was able to stand using a walker. He should do well with physical therapy he is very fit. Patient reports pain as 5 on 0-10 scale  .    Objective: Vital signs in last 24 hours: Filed Vitals:   01/29/14 1620 01/29/14 1754 01/29/14 2130 01/30/14 0701  BP: 134/81 118/70 134/72 149/89  Pulse: 56 83 81 103  Temp: 98 F (36.7 C)  98.8 F (37.1 C) 97.9 F (36.6 C)  TempSrc:   Oral Oral  Resp: 18  18 18   SpO2: 100%  99% 100%      Intake/Output from previous day: I/O last 3 completed shifts: In: 1840 [P.O.:240; I.V.:1600] Out: 1300 [Urine:1100; Blood:200]   Intake/Output this shift: Total I/O In: -  Out: 500 [Urine:500]   LABORATORY DATA:  Recent Labs  01/30/14 0438  WBC 8.3  HGB 13.4  HCT 38.3*  PLT 192  NA 131*  K 4.0  CL 93*  CO2 29  BUN 13  CREATININE 1.07  GLUCOSE 130*  CALCIUM 8.3*    Examination: Neurologically intact ABD soft Neurovascular intact Sensation intact distally Intact pulses distally Dorsiflexion/Plantar flexion intact Incision: no drainage No cellulitis present Compartment soft} XR AP&Lat of hip shows well placed\fixed THA  Assessment:   1 Day Post-Op Procedure(s) (LRB): TOTAL HIP ARTHROPLASTY (Left) ADDITIONAL DIAGNOSIS:    Plan: PT/OT WBAT, THA  posterior precautions  DVT Prophylaxis: SCDx72 hrs, ASA 325 mg BID x 2 weeks  DISCHARGE PLAN: Home, probably in one or 2 days when he passes physical therapy.  DISCHARGE NEEDS: HHPT, HHRN, CPM, Walker  and 3-in-1 comode seat

## 2014-01-30 NOTE — Evaluation (Signed)
Physical Therapy Evaluation Patient Details Name: Craig Myers MRN: [id number] DOB: 05/15/1966 Today's Date: 01/30/2014 Time: 4098-1191 PT Time Calculation (min): 30 min  PT Assessment / Plan / Recommendation History of Present Illness  L THA posterior 2 DJD. 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  Pt will benefit from skilled PT to increase their independence and safety with mobility to allow discharge to the venue listed below. Pt plans to have help from friends upon d/c.       PT Assessment  Patient needs continued PT services    Follow Up Recommendations  Home health PT    Does the patient have the potential to tolerate intense rehabilitation      Barriers to Discharge        Equipment Recommendations  Rolling walker with 5" wheels;3in1 (PT)    Recommendations for Other Services     Frequency 7X/week    Precautions / Restrictions Precautions Precautions: Posterior Hip Precaution Comments: Educated pt on 3/3 hip precautions Restrictions Weight Bearing Restrictions: Yes LLE Weight Bearing: Weight bearing as tolerated   Pertinent Vitals/Pain 6/10      Mobility  Bed Mobility Overal bed mobility: Needs Assistance Bed Mobility: Supine to Sit Supine to sit: Min assist;HOB elevated General bed mobility comments: assist with LLE, verbal cues for sequencing precautions Transfers Overall transfer level: Needs assistance Equipment used: Rolling walker (2 wheeled) Transfers: Sit to/from Stand Sit to Stand: Min assist Stand pivot transfers: Min guard General transfer comment: verbal cues for hand placement Ambulation/Gait Ambulation/Gait assistance: Min guard Ambulation Distance (Feet): 180 Feet Assistive device: Rolling walker (2 wheeled) Gait Pattern/deviations: Step-to pattern;Antalgic Gait velocity interpretation: Below  normal speed for age/gender    Exercises     PT Diagnosis: Difficulty walking;Acute pain  PT Problem List: Decreased strength;Decreased range of motion;Decreased activity tolerance;Decreased mobility;Decreased knowledge of precautions;Pain;Decreased knowledge of use of DME PT Treatment Interventions: DME instruction;Gait training;Stair training;Functional mobility training;Therapeutic activities;Therapeutic exercise;Patient/family education     PT Goals(Current goals can be found in the care plan section) Acute Rehab PT Goals Patient Stated Goal: independence, return to work PT Goal Formulation: With patient Time For Goal Achievement: 02/06/14 Potential to Achieve Goals: Good  Visit Information  Last PT Received On: 01/30/14 Assistance Needed: +1 History of Present Illness: L THA posterior 2 DJD. 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, epileptic       Prior Functioning  Home Living Family/patient expects to be discharged to:: Private residence Living Arrangements: Non-relatives/Friends Available Help at Discharge: Friend(s);Available PRN/intermittently Type of Home: House Home Access: Stairs to enter [employer] of Steps: 3 Entrance Stairs-Rails: Right Home Layout: One level Home Equipment: None Additional Comments:  (will need 3-1 commode ordered) Prior Function Level of Independence: Independent Communication Communication: No difficulties    Cognition  Cognition Arousal/Alertness: Awake/alert Behavior During Therapy: WFL for tasks assessed/performed Overall Cognitive Status: Within Functional Limits for tasks assessed    Extremity/Trunk Assessment Upper Extremity Assessment Upper Extremity Assessment: Overall WFL for tasks assessed   Balance    End of Session PT - End of Session Equipment Utilized During Treatment: Gait belt Activity Tolerance: Patient tolerated treatment well Patient left: in chair;with call  bell/phone within reach Nurse Communication: Patient requests pain meds;Mobility status  GP     Ilda Foil 01/30/2014, 10:20 AM  Aida Raider, PT  Office # [phone]  Pager [phone]

## 2014-01-30 NOTE — Evaluation (Signed)
Occupational Therapy Evaluation Patient Details Name: Craig Myers MRN: [id number] DOB: 12/21/1965 Today's Date: 01/30/2014 Time: 1324-40100920-1014 OT Time Calculation (min): 54 min  OT Assessment / Plan / Recommendation History of present illness L THA posterior 2 DJD. 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 Will be staying at a friends house and needs to be Mod I at D/C. Pt. Ed. On use of AE for LE ADLs and will need further training. Pt. Ed. On use of 3-1 commode and safe sit to stand and stand to sit. Pt. Ed. On hip precautions for ADLs and mobility. Pt. Will need further training on tub transfers.     OT Assessment  Patient needs continued OT Services    Follow Up Recommendations  No OT follow up    Barriers to Discharge Decreased caregiver support    Equipment Recommendations  3 in 1 bedside comode    Recommendations for Other Services    Frequency  Min 2X/week    Precautions / Restrictions Precautions Precautions: Posterior Hip Restrictions Weight Bearing Restrictions: Yes LLE Weight Bearing: Weight bearing as tolerated   Pertinent Vitals/Pain No c/o    ADL  Eating/Feeding: Simulated;Independent Where Assessed - Eating/Feeding: Chair Grooming: Simulated;Wash/dry hands;Wash/dry face;Set up Where Assessed - Grooming: Unsupported sitting Upper Body Bathing: Simulated;Set up Where Assessed - Upper Body Bathing: Unsupported sitting Lower Body Bathing: Simulated;Minimal assistance Where Assessed - Lower Body Bathing: Unsupported sitting;Unsupported standing Upper Body Dressing: Performed;Set up Where Assessed - Upper Body Dressing: Unsupported sitting Lower Body Dressing: Minimal assistance;Performed Where Assessed - Lower Body Dressing: Unsupported standing;Unsupported sitting (ed. on use of AE) Toilet Transfer: Hydrographic surveyorMin guard Toilet Transfer Method: Surveyor, mineralstand pivot Toilet Transfer Equipment: Holiday representativeBedside  commode Toileting - Clothing Manipulation and Hygiene: Min guard Where Assessed - Engineer, miningToileting Clothing Manipulation and Hygiene: Standing ADL Comments: Pt. ed. on use of AE but will need further ed. Pt. needs tub/shower transfer training.     OT Diagnosis: Generalized weakness  OT Problem List: Decreased activity tolerance;Decreased knowledge of use of DME or AE;Decreased knowledge of precautions OT Treatment Interventions: Self-care/ADL training;DME and/or AE instruction;Therapeutic activities   OT Goals(Current goals can be found in the care plan section) Acute Rehab OT Goals Patient Stated Goal: go home I OT Goal Formulation: With patient Time For Goal Achievement: 02/13/14 Potential to Achieve Goals: Good ADL Goals Pt Will Perform Lower Body Bathing: with modified independence;sit to/from stand Pt Will Perform Lower Body Dressing: with modified independence;sit to/from stand Pt Will Transfer to Toilet: with modified independence;bedside commode Pt Will Perform Tub/Shower Transfer: Tub transfer;with modified independence;ambulating  Visit Information  Last OT Received On: 01/30/14 Assistance Needed: +1 History of Present Illness: L THA posterior 2 DJD. 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, epileptic       Prior Functioning     Home Living Family/patient expects to be discharged to:: Private residence Living Arrangements: Non-relatives/Friends Available Help at Discharge: Friend(s);Available PRN/intermittently Type of Home: House Home Access: Stairs to enter [employer] CorporationEntrance Stairs-Number of Steps: 3 Entrance Stairs-Rails: Right Home Layout: One level Home Equipment: None Additional Comments:  (will need 3-1 commode ordered) Prior Function Level of Independence: Independent Communication Communication: No difficulties         Vision/Perception Vision - History Baseline Vision: No visual deficits   Cognition   Cognition Arousal/Alertness: Awake/alert Behavior During Therapy: WFL for tasks assessed/performed Overall Cognitive Status: Within Functional Limits for tasks assessed    Extremity/Trunk  Assessment Upper Extremity Assessment Upper Extremity Assessment: Overall WFL for tasks assessed     Mobility Bed Mobility Overal bed mobility: Needs Assistance Bed Mobility: Supine to Sit Supine to sit: Min assist;HOB elevated General bed mobility comments: assist with LLE, verbal cues for sequencing precautions Transfers Overall transfer level: Needs assistance Equipment used: Rolling walker (2 wheeled) Transfers: Sit to/from Stand Sit to Stand: Min assist Stand pivot transfers: Min guard General transfer comment: verbal cues for hand placement     Exercise     Balance     End of Session OT - End of Session Equipment Utilized During Treatment: Rolling walker Activity Tolerance: Patient tolerated treatment well Patient left: in chair;with call bell/phone within reach  GO     [name] 01/30/2014, 10:11 AM

## 2014-01-31 LAB — CBC
HCT: 40.7 % (ref 39.0–52.0)
Hemoglobin: 14.3 g/dL (ref 13.0–17.0)
MCH: 32.1 pg (ref 26.0–34.0)
MCHC: 35.1 g/dL (ref 30.0–36.0)
MCV: 91.5 fL (ref 78.0–100.0)
Platelets: 178 10*3/uL (ref 150–400)
RBC: 4.45 MIL/uL (ref 4.22–5.81)
RDW: 15.9 % — AB (ref 11.5–15.5)
WBC: 9 10*3/uL (ref 4.0–10.5)

## 2014-01-31 NOTE — Progress Notes (Signed)
   CARE MANAGEMENT NOTE 01/31/2014  Patient:  Craig Myers, Craig Myers   Account Number:  [id number]  Date Initiated:  01/31/2014  Documentation initiated by:  Lizabeth Leyden  Subjective/Objective Assessment:   admitted for hip arthroplasty     Action/Plan:   Home Health RN, PT,OT  DME: 3:1, RW, crutches   Anticipated DC Date:  01/31/2014   Anticipated DC Plan:  [place]  CM consult      [facility] Choice  HOME HEALTH   Choice offered to / List presented to:  C-1 Patient   DME arranged  3-N-1  CRUTCHES  [place] arranged  HH-1 RN  [place].   Status of service:  Completed, signed off Medicare Important Message given?   (If response is "NO", the following Medicare IM given date fields will be blank) Date Medicare IM given:   Date Additional Medicare IM given:    Discharge Disposition:  [place]  Per UR Regulation:    If discussed at Long Length of Stay Meetings, dates discussed:    Comments:  01/31/2014  [place] RN, CCM [phone] CM referral:  home health RN, PT, OT  Met with patient regarding choice for home health services. He stated someone saw him yesterday.  [facility] verified referral

## 2014-01-31 NOTE — Progress Notes (Signed)
Discharge instructions and prescriptions given and explained to patient. Patient denies questions or concerns. IV removed. Vital signs stable. Patient discharged with home meds, personal belongings, and discharge packet/prescriptions.

## 2014-01-31 NOTE — Progress Notes (Signed)
Clinical Social Worker (CSW) received consult for SNF placement. Per chart patient is going home with home health services. Please reconsult if further social work needs arise. CSW signing off.   Baylen Buckner Morgan, LCSWA Weekend CSW [phone]  

## 2014-01-31 NOTE — Progress Notes (Signed)
Physical Therapy Treatment Patient Details Name: Craig Myers MRN: [id number] DOB: Sep 25, 1966 Today's Date: 01/31/2014 Time: [id number] PT Time Calculation (min): 44 min  PT Assessment / Plan / Recommendation  History of Present Illness L THA posterior 2 DJD. 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, epileptic   PT Comments   Making excellent progress; Able to practice walking with crutches and RW; stair training complete; OK for dchome from PT standpoint  Follow Up Recommendations  Home health PT     Does the patient have the potential to tolerate intense rehabilitation     Barriers to Discharge        Equipment Recommendations  Rolling walker with 5" wheels;3in1 (PT)    Recommendations for Other Services    Frequency 7X/week   Progress towards PT Goals Progress towards PT goals: Progressing toward goals  Plan Current plan remains appropriate    Precautions / Restrictions Precautions Precautions: Posterior Hip Precaution Comments: Educated pt on 3/3 hip precautions; needed reinforcement for no crossing legs Restrictions Weight Bearing Restrictions: Yes LLE Weight Bearing: Weight bearing as tolerated   Pertinent Vitals/Pain 2/10 L hip with walking patient repositioned for comfort     Mobility  Bed Mobility Overal bed mobility: Needs Assistance Bed Mobility: Supine to Sit;Sit to Supine Supine to sit: Min guard Sit to supine: Min guard General bed mobility comments: Cues for using blanket roll and axilla end of crutch to asist LLE in/out of bed Transfers Overall transfer level: Needs assistance Equipment used: Rolling walker (2 wheeled) Transfers: Sit to/from Stand Sit to Stand: Min guard General transfer comment: verbal cues for hand placement, and Crutch use Ambulation/Gait Ambulation/Gait assistance: Supervision Ambulation Distance (Feet): 520 Feet (x2; once with RW, once with crutches) Assistive device: Rolling walker (2  wheeled);Crutches Gait Pattern/deviations: Step-to pattern;Step-through pattern General Gait Details: Cues for step length, and to work towards step through pattern for more efficient gait; Cues also to self-monitor for activity tolerance and be careful of no internal rotation L hip with turning Stairs: Yes Stairs assistance: Supervision Stair Management: Two rails;Step to pattern;Forwards Number of Stairs: 3 General stair comments: Verbal and demo cues for technique    Exercises Total Joint Exercises Quad Sets: AROM;Left;10 reps Gluteal Sets: AROM;Both;10 reps Towel Squeeze: AROM;Left;10 reps Heel Slides: AROM;Left;10 reps (very good range) Hip ABduction/ADduction: AROM;Left;10 reps Bridges: AROM;Both;10 reps   PT Diagnosis:    PT Problem List:   PT Treatment Interventions:     PT Goals (current goals can now be found in the care plan section) Acute Rehab PT Goals Patient Stated Goal: independence, return to work PT Goal Formulation: With patient Time For Goal Achievement: 02/06/14 Potential to Achieve Goals: Good  Visit Information  Last PT Received On: 01/31/14 Assistance Needed: +1 History of Present Illness: L THA posterior 2 DJD. 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, epileptic    Subjective Data  Subjective: REALLY wanting to go home Patient Stated Goal: independence, return to work   Cognition  Cognition Arousal/Alertness: Awake/alert Behavior During Therapy: WFL for tasks assessed/performed Overall Cognitive Status: Within Functional Limits for tasks assessed    Balance     End of Session PT - End of Session Activity Tolerance: Patient tolerated treatment well Patient left: in bed;with call bell/phone within reach Nurse Communication: Mobility status   GP     Van ClinesGarrigan, Loreen Bankson Hamff 01/31/2014, 10:32 AM  Van ClinesHolly Makari Sanko, PT  Acute Rehabilitation Services Pager [phone] Office [phone]

## 2014-01-31 NOTE — Progress Notes (Signed)
PATIENT ID: Craig LericheMark Wisher   2 Days Post-Op Procedure(s) (LRB): TOTAL HIP ARTHROPLASTY (Left)  Subjective: Patient doing well, up walking laps around floor with PT this am. Minimal pain. Ready to go home.   Objective:  Filed Vitals:   01/31/14 0552  BP: 136/74  Pulse: 80  Temp: 98.4 F (36.9 C)  Resp: 18     AWake, alert, orienated Left hip dressing c/d/i Wiggles toes, distally NVI Compartments soft/nontender  Labs:   Recent Labs  01/30/14 0438 01/31/14 0610  HGB 13.4 14.3   Recent Labs  01/30/14 0438 01/31/14 0610  WBC 8.3 9.0  RBC 4.20* 4.45  HCT 38.3* 40.7  PLT 192 178   Recent Labs  01/30/14 0438  NA 131*  K 4.0  CL 93*  CO2 29  BUN 13  CREATININE 1.07  GLUCOSE 130*  CALCIUM 8.3*    Assessment and Plan: Doing great with PT, ready to go home PT recommends crutches, in addition to rolling walker to increase independence and patient agrees, will order today D/c home today, scripts in chart  PT/OT WBAT, THA posterior precautions  DVT Prophylaxis: SCDx72 hrs, ASA 325 mg BID x 2 weeks  DISCHARGE NEEDS: HHPT, HHRN, CPM, Walker and 3-in-1 comode seat

## 2014-02-01 NOTE — Discharge Summary (Signed)
Patient ID: Craig Myers MRN: [id number] DOB/AGE: 1966-09-09 47 y.o.  Admit date: 01/29/2014 Discharge date: 01/30/2014  Admission Diagnoses:  Principal Problem:   Arthritis of left hip Active Problems:   Hip arthritis   Discharge Diagnoses:  Same  Past Medical History  Diagnosis Date  . Hypertension     hx/o borderline, controlled currently 6/12  . PTSD (post-traumatic stress disorder)   . Anxiety   . Pulmonary embolism   . Epileptic     seizure 2 years ago - takes Xanax .  One time seizure was do to running out of Xanax  . Arthritis   . Headache(784.0)     after back surgery    Surgeries: Procedure(s): TOTAL HIP ARTHROPLASTY on 01/29/2014   Consultants:    Discharged Condition: Improved  Hospital Course: Craig Myers is an 48 y.o. male who was admitted 01/29/2014 for operative treatment ofArthritis of left hip. Patient has severe unremitting pain that affects sleep, daily activities, and work/hobbies. After pre-op clearance the patient was taken to the operating room on 01/29/2014 and underwent  Procedure(s): TOTAL HIP ARTHROPLASTY.    Patient was given perioperative antibiotics: Anti-infectives   Start     Dose/Rate Route Frequency Ordered Stop   01/29/14 1029  ceFAZolin (ANCEF) 2-3 GM-% IVPB SOLR    Comments:  Craig Myers, Craig Myers   : cabinet override      01/29/14 1029 01/29/14 2244   01/29/14 0600  ceFAZolin (ANCEF) IVPB 2 g/50 mL premix     2 g 100 mL/hr over 30 Minutes Intravenous On call to O.R. 01/28/14 1408 01/29/14 1239       Patient was given sequential compression devices, early ambulation, and ASA325mg  BID to prevent DVT.  Patient benefited maximally from hospital stay and there were no complications.    Recent vital signs: No data found.    Recent laboratory studies:  Recent Labs  01/30/14 0438 01/31/14 0610  WBC 8.3 9.0  HGB 13.4 14.3  HCT 38.3* 40.7  PLT 192 178  NA 131*  --   K 4.0  --   CL 93*  --   CO2 29  --   BUN 13  --   CREATININE  1.07  --   GLUCOSE 130*  --   CALCIUM 8.3*  --      Discharge Medications:     Medication List         alprazolam 2 MG tablet  Commonly known as:  XANAX  Take 1.5 mg by mouth at bedtime.     alprazolam 2 MG tablet  Commonly known as:  XANAX  Take 1 mg by mouth daily.     aspirin EC 325 MG tablet  Take 1 tablet (325 mg total) by mouth 2 (two) times daily.     hydrochlorothiazide 25 MG tablet  Commonly known as:  HYDRODIURIL  Take 25 mg by mouth daily.     ibuprofen 200 MG tablet  Commonly known as:  ADVIL,MOTRIN  Take 200 mg by mouth every 6 (six) hours as needed for pain.     losartan 50 MG tablet  Commonly known as:  COZAAR  Take 50 mg by mouth daily.     methocarbamol 500 MG tablet  Commonly known as:  ROBAXIN  Take 1 tablet (500 mg total) by mouth 2 (two) times daily with a meal.     oxyCODONE-acetaminophen 5-325 MG per tablet  Commonly known as:  ROXICET  Take 1 tablet by mouth every 4 (four) hours as  needed.        Diagnostic Studies: Dg Chest 2 View  01/21/2014   CLINICAL DATA:  Pre-admission for hip surgery  EXAM: CHEST  2 VIEW  COMPARISON:  None.  FINDINGS: Cardiomediastinal silhouette is unremarkable. No acute infiltrate or pleural effusion. No pulmonary edema. Mild degenerative changes mid thoracic spine.  IMPRESSION: No active cardiopulmonary disease.   Electronically Signed   By: Natasha Mead M.D.   On: 01/21/2014 16:24   Dg Pelvis Portable  01/29/2014   CLINICAL DATA:  Postoperative for left total hip arthroplasty  EXAM: PORTABLE PELVIS 1-2 VIEWS  COMPARISON:  MR HIP*L* WO/W CM dated 05/29/2011  FINDINGS: Frontal view of the pelvis demonstrates left hip arthroplasty in place without complicating feature involving the pelvis or proximal femur. The stem of the arthroplasty is only partially included on this exam.  There is mild right degenerative hip arthropathy.  IMPRESSION: 1. Left total hip arthroplasty, without fracture or complicating feature observed.  Please note that the distal portion of the stem was not included on this radiograph. 2. Mild degenerative arthropathy of the right hip.   Electronically Signed   By: Herbie Baltimore M.D.   On: 01/29/2014 15:34    Disposition: 06-Home-Health Care Svc      Discharge Orders   Future Orders Complete By Expires   Call MD / Call 911  As directed    Comments:     If you experience chest pain or shortness of breath, CALL 911 and be transported to the hospital emergency room.  If you develope a fever above 101 F, pus (white drainage) or increased drainage or redness at the wound, or calf pain, call your surgeon's office.   Constipation Prevention  As directed    Comments:     Drink plenty of fluids.  Prune juice may be helpful.  You may use a stool softener, such as Colace (over the counter) 100 mg twice a day.  Use MiraLax (over the counter) for constipation as needed.   Diet - low sodium heart healthy  As directed    Increase activity slowly as tolerated  As directed       Follow-up Information   Follow up with Nestor Lewandowsky, MD In 2 weeks.   Specialty:  Orthopedic Surgery   Contact information:   [address] [place] [address] [phone]       Follow up with [facility]. (RN, PT, OT visits )    Contact information:   [address] [place] [address] [phone]        Signed: Jiles Harold 02/01/2014, 10:04 AM

## 2014-02-02 ENCOUNTER — Encounter (HOSPITAL_COMMUNITY): Payer: Self-pay | Admitting: Orthopedic Surgery

## 2014-02-05 NOTE — Anesthesia Postprocedure Evaluation (Signed)
  Anesthesia Post-op Note  Patient: Craig Myers  Procedure(s) Performed: Procedure(s): TOTAL HIP ARTHROPLASTY (Left)  Patient Location: PACU  Anesthesia Type:General  Level of Consciousness: awake and alert   Airway and Oxygen Therapy: Patient Spontanous Breathing  Post-op Pain: mild  Post-op Assessment: Post-op Vital signs reviewed  Post-op Vital Signs: stable  Complications: No apparent anesthesia complications

## 2015-02-26 IMAGING — CR DG SHOULDER 1V*L*
2 series · 2 of 2 positions shown · non-contrast
Comparison: 02/27/2013, [DATE] hours

CLINICAL DATA: Status post reduction

PORTABLE LEFT SHOULDER - 2+ VIEW

[AP (1 of 2)]
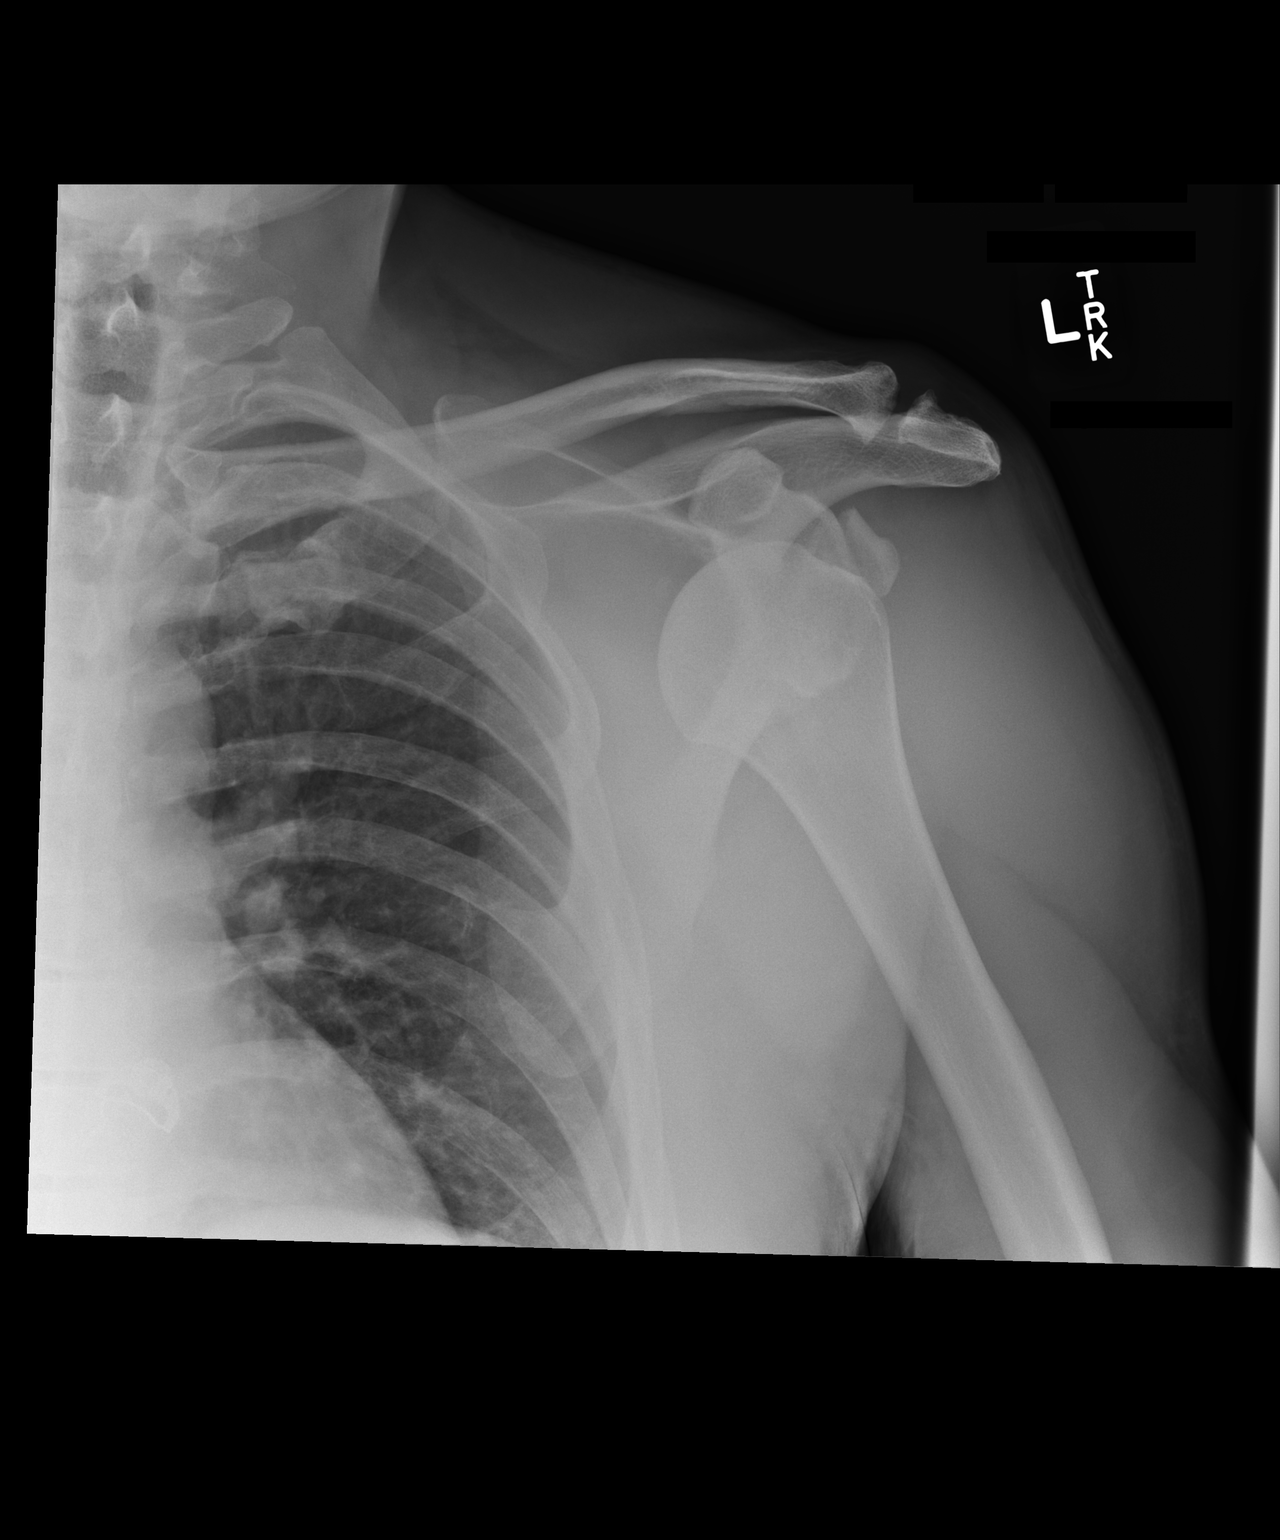

[AP (2 of 2)]
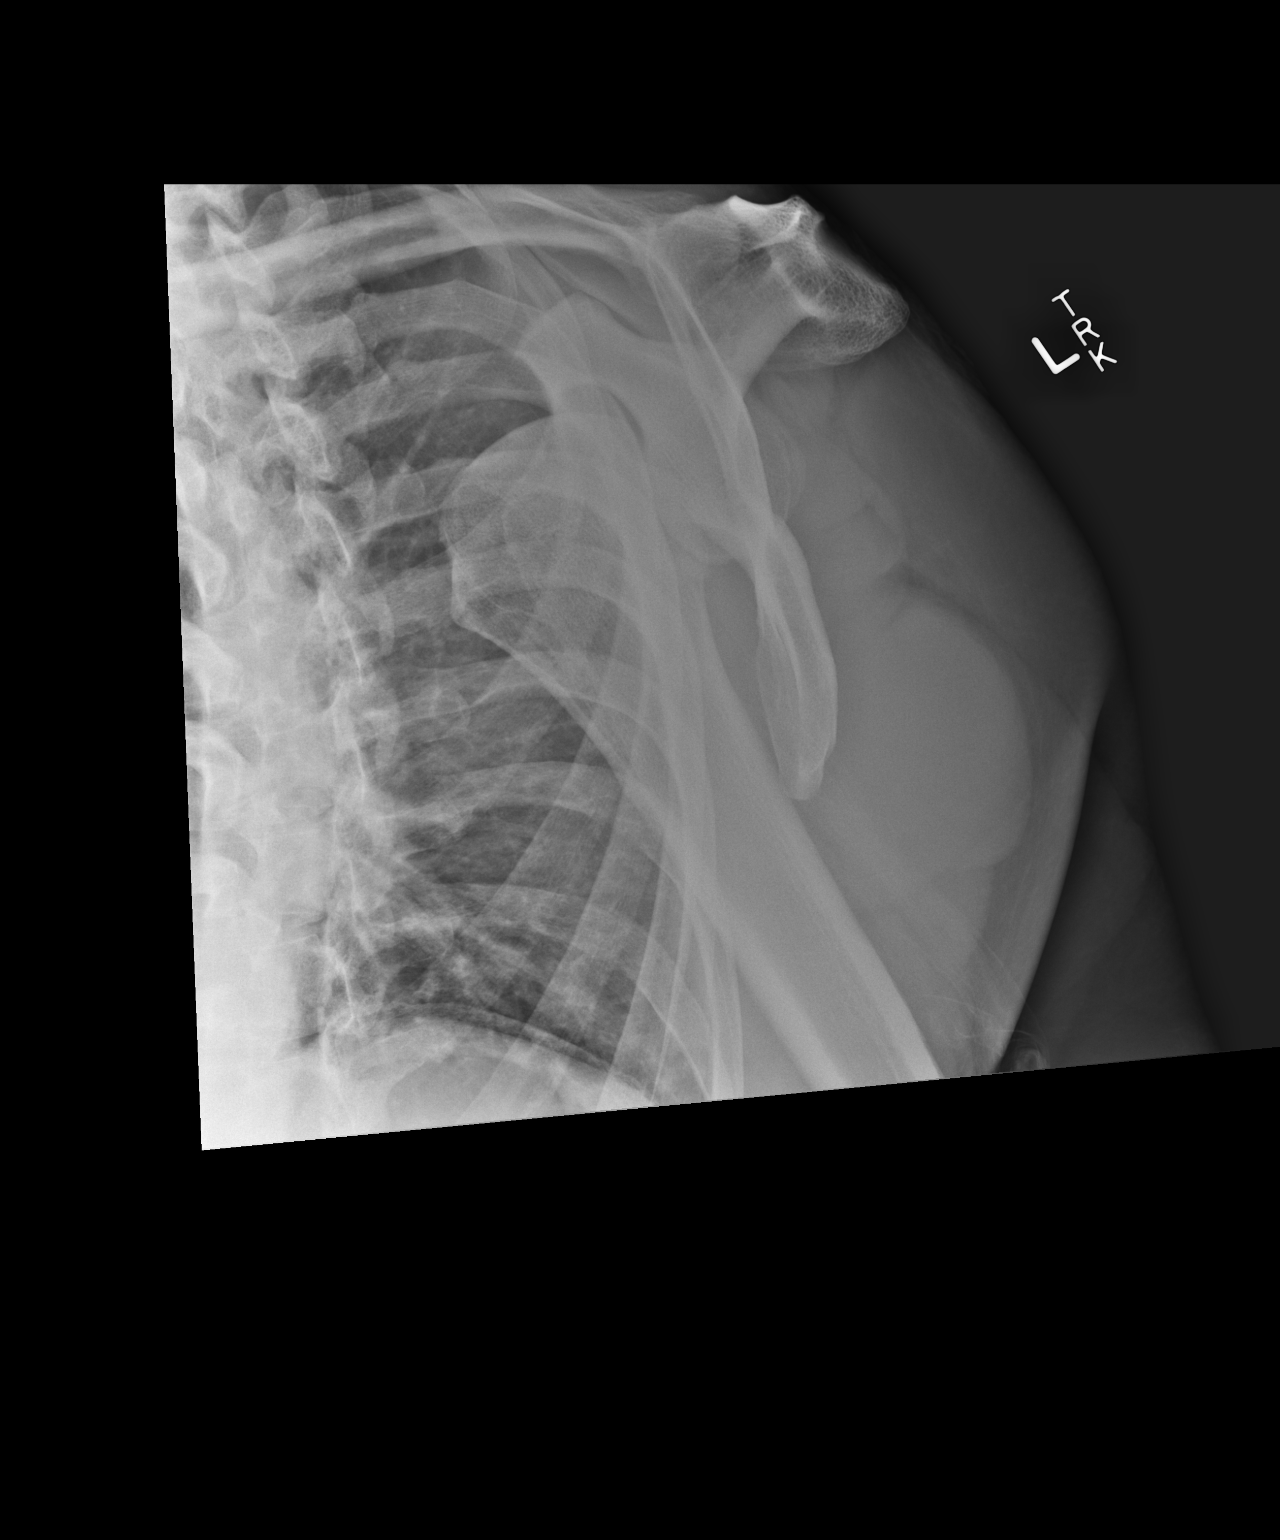

[2 of 2 positions shown; findings below may reference images not displayed]

FINDINGS: The humeral head remains inferiorly and anteriorly
dislocated.  The bony density adjacent to the glenoid is again
identified suspicious for a glenoid fracture.  No other focal
abnormality is noted.
IMPRESSION: Persistent dislocation of the left shoulder.

## 2015-02-26 IMAGING — CR DG SHOULDER 2+V*L*
2 series · 2 of 2 positions shown · non-contrast
Comparison: Left shoulder films of 11/25/2011

CLINICAL DATA: History of seizures, left shoulder pain

LEFT SHOULDER - 2+ VIEW

[w shoulder internal left]
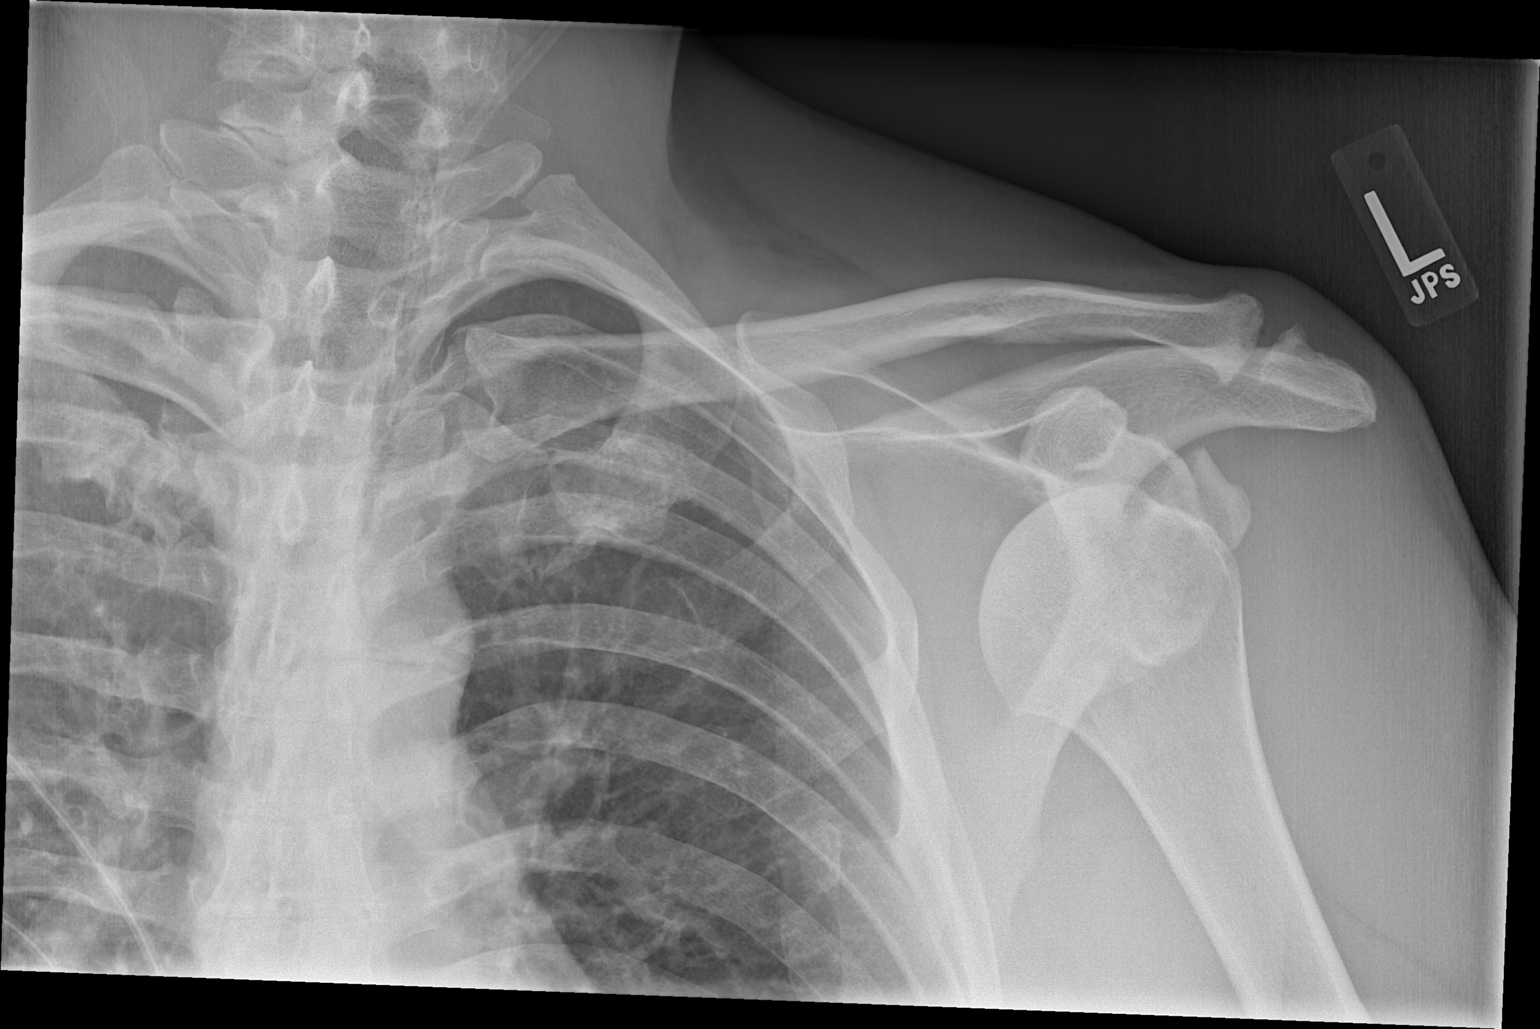

[w shoulder y-view left]
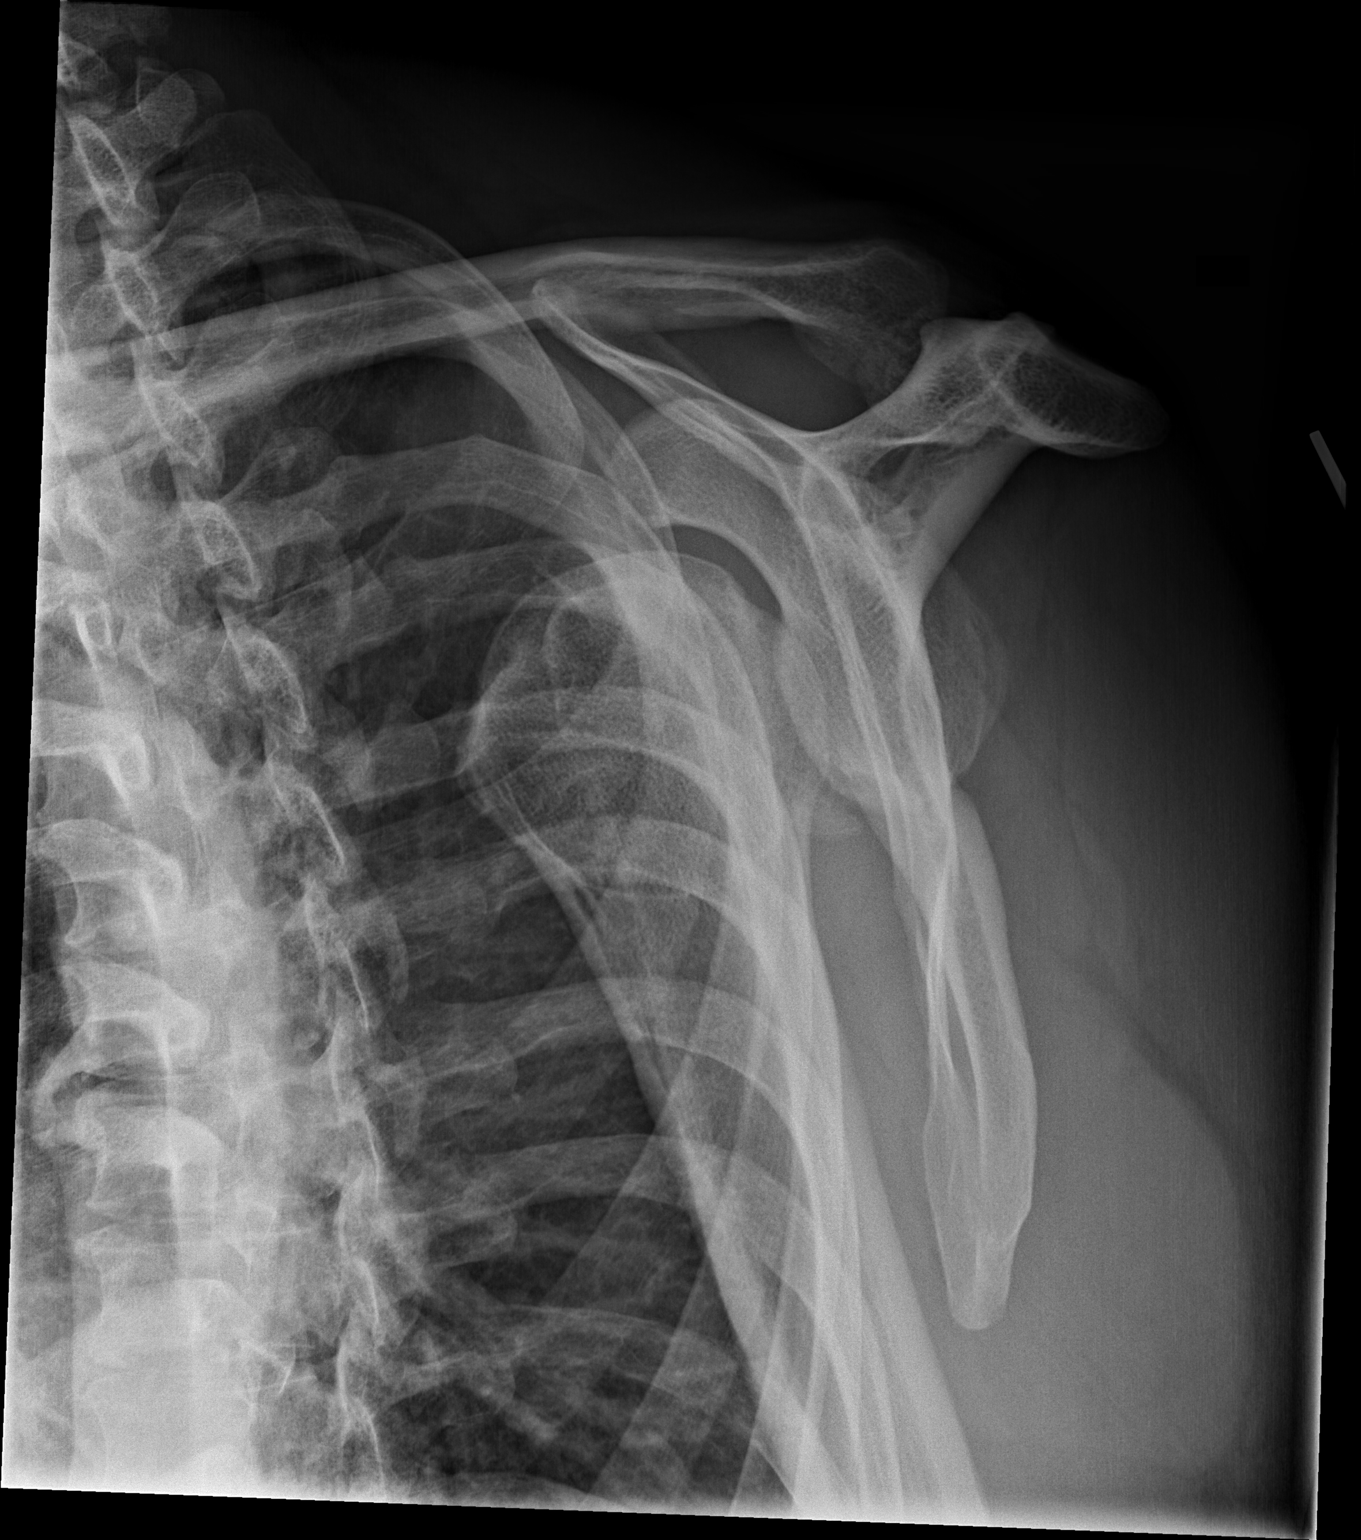

[2 of 2 positions shown; findings below may reference images not displayed]

FINDINGS: There is anterior dislocation of the left humeral head.
Also, a fracture fragment possibly from the glenoid rim cannot be
excluded.  Degenerative change is noted at the left AC joint.
IMPRESSION: Anterior dislocation with questionable fracture possibly of the
glenoid rim.

## 2015-03-07 IMAGING — CR DG SHOULDER 1V*L*
2 series · 2 of 2 positions shown · non-contrast
Comparison: 03/08/2013 at 8380 hours

CLINICAL DATA: Post reduction

PORTABLE LEFT SHOULDER - 2+ VIEW

[AP (1 of 2)]
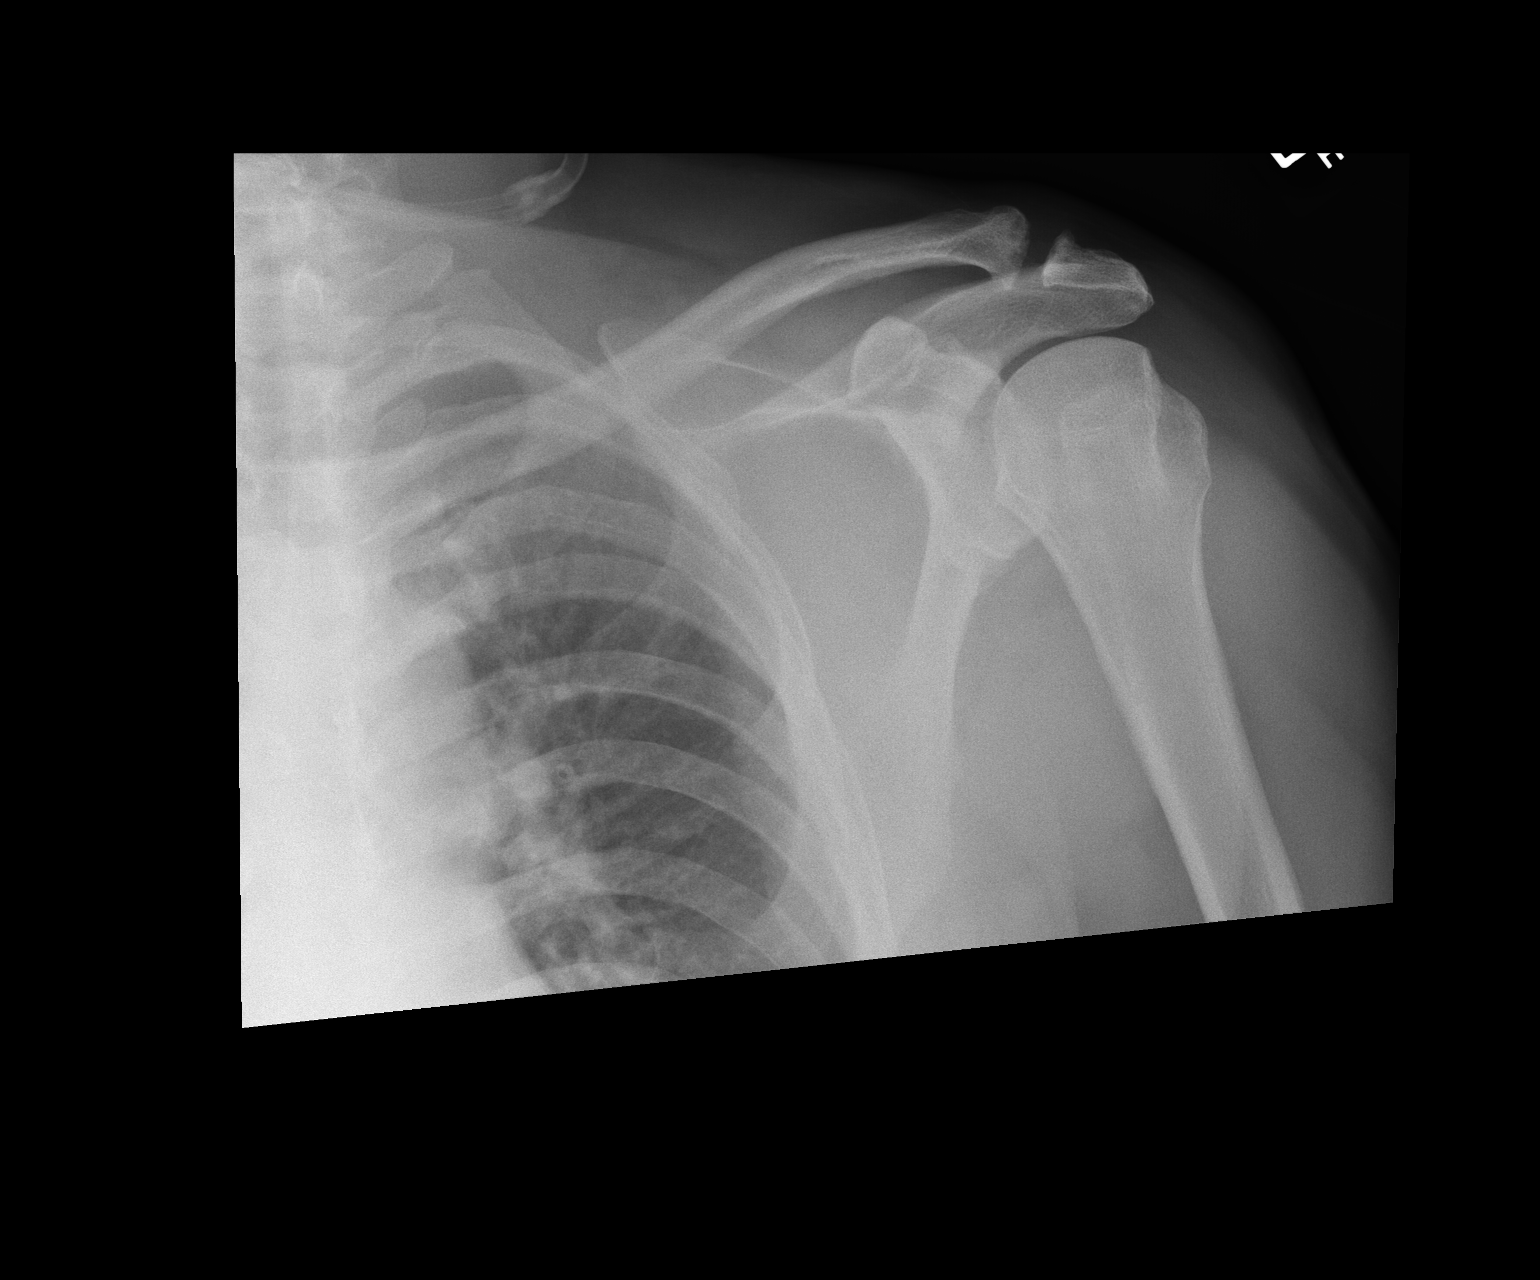

[AP (2 of 2)]
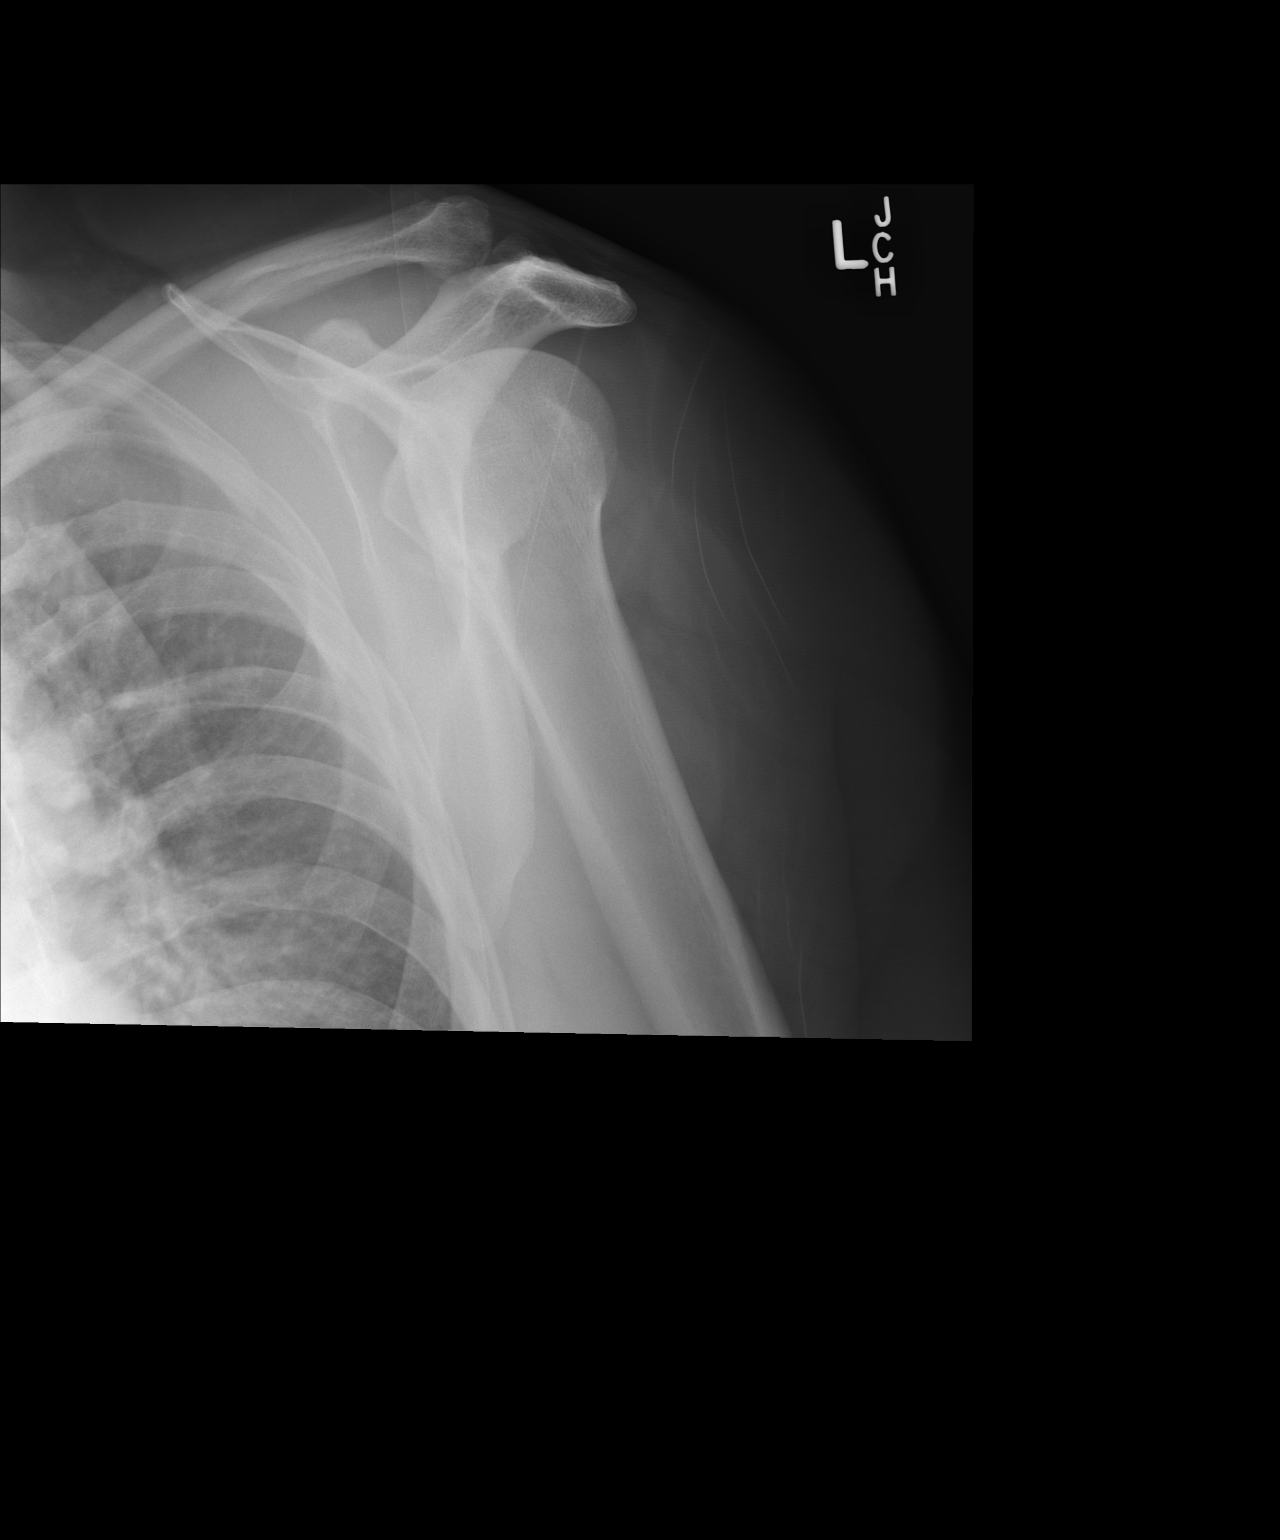

[2 of 2 positions shown; findings below may reference images not displayed]

FINDINGS: Interval reduction of the left shoulder.

No fracture is seen.

Degenerative changes of the acromioclavicular joint.

Visualized left lung is clear.
IMPRESSION: Interval reduction of the left shoulder.

No fracture is seen.

## 2016-11-29 ENCOUNTER — Telehealth (INDEPENDENT_AMBULATORY_CARE_PROVIDER_SITE_OTHER): Payer: Self-pay | Admitting: Orthopaedic Surgery

## 2016-11-29 NOTE — Telephone Encounter (Signed)
Patient is scheduled for new patient appt with Dr. Cleophas DunkerWhitfield on 12/07/16. 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 [phone]#[phone]

## 2016-11-29 NOTE — Telephone Encounter (Signed)
Spoke with girlfriend and let her know we do not do same day MRI. I gave her general information regarding how our referral process works.

## 2016-12-05 ENCOUNTER — Ambulatory Visit (INDEPENDENT_AMBULATORY_CARE_PROVIDER_SITE_OTHER): Payer: Self-pay | Admitting: Orthopedic Surgery

## 2016-12-07 ENCOUNTER — Ambulatory Visit (INDEPENDENT_AMBULATORY_CARE_PROVIDER_SITE_OTHER): Payer: Self-pay | Admitting: Orthopaedic Surgery

## 2016-12-21 ENCOUNTER — Ambulatory Visit (INDEPENDENT_AMBULATORY_CARE_PROVIDER_SITE_OTHER): Payer: Self-pay | Admitting: Orthopaedic Surgery

## 2016-12-27 ENCOUNTER — Ambulatory Visit (INDEPENDENT_AMBULATORY_CARE_PROVIDER_SITE_OTHER): Payer: Self-pay | Admitting: Orthopaedic Surgery
